# Patient Record
Sex: Female | Born: 2000 | Race: Black or African American | Hispanic: No | Marital: Single | Smoking: Never smoker
Health system: Southern US, Community
[De-identification: ages and names within clinical notes are randomized; demographics above are authoritative.]

## PROBLEM LIST (undated history)

## (undated) DIAGNOSIS — G43909 Migraine, unspecified, not intractable, without status migrainosus: Secondary | ICD-10-CM

## (undated) DIAGNOSIS — J45909 Unspecified asthma, uncomplicated: Secondary | ICD-10-CM

## (undated) DIAGNOSIS — J9801 Acute bronchospasm: Secondary | ICD-10-CM

## (undated) DIAGNOSIS — T7840XA Allergy, unspecified, initial encounter: Secondary | ICD-10-CM

## (undated) DIAGNOSIS — F419 Anxiety disorder, unspecified: Secondary | ICD-10-CM

## (undated) HISTORY — DX: Anxiety disorder, unspecified: F41.9

## (undated) HISTORY — PX: OTHER SURGICAL HISTORY: SHX169

## (undated) HISTORY — DX: Acute bronchospasm: J98.01

## (undated) HISTORY — DX: Allergy, unspecified, initial encounter: T78.40XA

## (undated) HISTORY — DX: Unspecified asthma, uncomplicated: J45.909

---
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
## 2022-01-10 ENCOUNTER — Other Ambulatory Visit: Payer: Self-pay | Admitting: Internal Medicine

## 2022-01-10 DIAGNOSIS — F411 Generalized anxiety disorder: Secondary | ICD-10-CM

## 2022-01-17 ENCOUNTER — Ambulatory Visit (INDEPENDENT_AMBULATORY_CARE_PROVIDER_SITE_OTHER): Payer: Commercial Managed Care - PPO | Admitting: Family Medicine

## 2022-01-17 ENCOUNTER — Other Ambulatory Visit: Payer: Self-pay

## 2022-01-17 ENCOUNTER — Encounter: Payer: Self-pay | Admitting: Family Medicine

## 2022-01-17 VITALS — BP 111/77 | HR 107 | Temp 99.6°F | Ht 64.0 in | Wt 209.0 lb

## 2022-01-17 DIAGNOSIS — Z3202 Encounter for pregnancy test, result negative: Secondary | ICD-10-CM | POA: Diagnosis not present

## 2022-01-17 DIAGNOSIS — Z30011 Encounter for initial prescription of contraceptive pills: Secondary | ICD-10-CM

## 2022-01-17 DIAGNOSIS — F411 Generalized anxiety disorder: Secondary | ICD-10-CM

## 2022-01-17 LAB — POCT URINE PREGNANCY: Preg Test, Ur: NEGATIVE

## 2022-01-17 MED ORDER — NORGESTIMATE-ETH ESTRADIOL 0.25-35 MG-MCG PO TABS: 1.0000 | ORAL_TABLET | Freq: Every day | ORAL | 0 refills | Status: DC

## 2022-01-17 NOTE — Progress Notes (Signed)
Discuss birth control options Medication refills  for anxiety medication- fluoxetine  LMP1/23/2023

## 2022-01-19 ENCOUNTER — Encounter: Payer: Self-pay | Admitting: Family Medicine

## 2022-01-19 NOTE — Progress Notes (Signed)
New Patient Office Visit  Subjective:  Patient ID: Brittney Green, female    DOB: [birth year]  Age: 21 y.o. MRN: [id number]  CC:  Chief Complaint  Patient presents with   Contraception    HPI Brittney Green presents for desiring birth control. Patient reports that she has been having unprotected intercourse. Denies GU sx.   Past Medical History:  Diagnosis Date   Allergic rhinitis    Allergy    Anxiety    Phreesia 03/03/2021   Asthma    reactive airway disease as infant.   Bronchospasm     Past Surgical History:  Procedure Laterality Date   tubes in ears      Family History  Problem Relation Age of Onset   Asthma Mother    COPD Maternal Grandmother    Emphysema Maternal Grandmother    Diabetes Maternal Grandfather    Depression Sister    Alcohol abuse Maternal Aunt        per patient history form drug abuse    Social History   Socioeconomic History   Marital status: Single    Spouse name: Not on file   Number of children: 0   Years of education: Not on file   Highest education level: Not on file  Occupational History   Occupation: student  Tobacco Use   Smoking status: Never   Smokeless tobacco: Never  Vaping Use   Vaping Use: Never used  Substance and Sexual Activity   Alcohol use: No   Drug use: No   Sexual activity: Never  Other Topics Concern   Not on file  Social History Narrative   Lives: Patient lives with mother, sister, grandfather; Dad in [place]; rare visits with Dad last visit in 5th grade.      Education: 8th grader; [employer]; favorite subject is math; no held back or failing; excellent student; no behavior issues.   Career goals:  Unsure yet.     Tobacco: no smokers in the home.      Pets:  none      Seatbelt: 100%      Bedtime in 2017 at 10:00pm; has cell phone.  Wakes up 7:00am.        Dental hygiene: brushes teeth once daily.  No nighttime brushing.  No flossing.      Activities: plays softball and basketball; skating; watches  YouTube.        Punishment: rarely gets punished.  Depends on severity of issue; phone taken away .      Helmets:  No helmet.    Social Determinants of Health   Financial Resource Strain: Not on file  Food Insecurity: Not on file  Transportation Needs: Not on file  Physical Activity: Not on file  Stress: Not on file  Social Connections: Not on file  Intimate Partner Violence: Not on file    ROS Review of Systems  Genitourinary:  Negative for vaginal bleeding.  All other systems reviewed and are negative.  Objective:   Today's Vitals: BP 111/77 (BP Location: Right Arm, Patient Position: Sitting, Cuff Size: Large)    Pulse (!) 107    Temp 99.6 F (37.6 C) (Oral)    Ht 5\' 4"  (1.626 m)    Wt 209 lb (94.8 kg)    LMP 01/10/2022 (Exact Date)    SpO2 98%    BMI 35.87 kg/m   Physical Exam Vitals and nursing note reviewed.  Constitutional:      General: She is not in acute distress.  Cardiovascular:     Rate and Rhythm: Normal rate and regular rhythm.  Pulmonary:     Effort: Pulmonary effort is normal.     Breath sounds: Normal breath sounds.  Abdominal:     Palpations: Abdomen is soft.     Tenderness: There is no abdominal tenderness.  Neurological:     General: No focal deficit present.     Mental Status: She is alert and oriented to person, place, and time.    Assessment & Plan:   1. Encounter for initial prescription of contraceptive pills Discussed options at length. Will prescribed ortho cyclen and monitor.  - POCT urine pregnancy  2. Generalized anxiety disorder Continue management and meds as per consultant.    Outpatient Encounter Medications as of 01/17/2022  Medication Sig   FLUoxetine (PROZAC) 10 MG capsule TAKE ONE TABLET DAILY FOR TWO WEEKS. IF TOLERATING, INCREASE TO TWO TABLETS DAILY.   norgestimate-ethinyl estradiol (ORTHO-CYCLEN, 28,) 0.25-35 MG-MCG tablet Take 1 tablet by mouth daily.   cetirizine (ZYRTEC ALLERGY) 10 MG tablet Take 1 tablet (10 mg total)  by mouth daily. (Patient not taking: Reported on 01/17/2022)   fluticasone (FLONASE) 50 MCG/ACT nasal spray Place 2 sprays into both nostrils daily. (Patient not taking: Reported on 01/17/2022)   [DISCONTINUED] cefdinir (OMNICEF) 300 MG capsule Take 1 capsule (300 mg total) by mouth 2 (two) times daily.   [DISCONTINUED] pseudoephedrine (SUDAFED) 30 MG tablet Take 1 tablet (30 mg total) by mouth every 8 (eight) hours as needed for congestion. (Patient not taking: Reported on 01/17/2022)   No facility-administered encounter medications on file as of 01/17/2022.    Follow-up: No follow-ups on file.   Tommie Raymond, MD

## 2022-02-03 ENCOUNTER — Other Ambulatory Visit: Payer: Self-pay | Admitting: Family Medicine

## 2022-02-03 ENCOUNTER — Encounter: Payer: Self-pay | Admitting: Family Medicine

## 2022-02-03 MED ORDER — FLUOXETINE HCL 20 MG PO TABS: 20.0000 mg | ORAL_TABLET | Freq: Every day | ORAL | 1 refills | Status: DC

## 2022-02-24 ENCOUNTER — Ambulatory Visit: Payer: Commercial Managed Care - PPO | Admitting: Family Medicine

## 2022-03-16 ENCOUNTER — Ambulatory Visit: Payer: Commercial Managed Care - PPO | Admitting: Family Medicine

## 2022-03-22 ENCOUNTER — Ambulatory Visit: Payer: Commercial Managed Care - PPO | Admitting: Family Medicine

## 2022-03-22 ENCOUNTER — Encounter: Payer: Self-pay | Admitting: Family Medicine

## 2022-03-22 VITALS — BP 105/73 | HR 76 | Temp 98.0°F | Resp 16 | Wt 211.6 lb

## 2022-03-22 DIAGNOSIS — F32A Depression, unspecified: Secondary | ICD-10-CM

## 2022-03-22 DIAGNOSIS — Z3041 Encounter for surveillance of contraceptive pills: Secondary | ICD-10-CM

## 2022-03-22 DIAGNOSIS — F419 Anxiety disorder, unspecified: Secondary | ICD-10-CM

## 2022-03-22 DIAGNOSIS — H663X1 Other chronic suppurative otitis media, right ear: Secondary | ICD-10-CM | POA: Diagnosis not present

## 2022-03-22 MED ORDER — NORGESTIMATE-ETH ESTRADIOL 0.25-35 MG-MCG PO TABS: 1.0000 | ORAL_TABLET | Freq: Every day | ORAL | 0 refills | Status: DC

## 2022-03-22 MED ORDER — FLUTICASONE PROPIONATE 50 MCG/ACT NA SUSP: 2.0000 | Freq: Every day | NASAL | 0 refills | Status: DC

## 2022-03-22 MED ORDER — CEFDINIR 300 MG PO CAPS: 300.0000 mg | ORAL_CAPSULE | Freq: Two times a day (BID) | ORAL | 0 refills | Status: DC

## 2022-03-22 MED ORDER — FLUOXETINE HCL 20 MG PO TABS: 20.0000 mg | ORAL_TABLET | Freq: Every day | ORAL | 1 refills | Status: DC

## 2022-03-22 NOTE — Progress Notes (Signed)
Patient has possible ear infection.  ?

## 2022-03-22 NOTE — Progress Notes (Signed)
? ?Established Patient Office Visit ? ?Subjective:  ?Patient ID: Brittney Green, female    DOB: 2001-10-12  Age: 21 y.o. MRN: [id number] ? ?CC: No chief complaint on file. ? ? ?HPI ?Kelsye Loomer presents for complaint of right ear pain. Patient reports that she has had recurrent issues with the ear. She denies fever/chills or viral sx. She also is here for follow up of contraception and anxiety/depression and she reports much improvement with present management.  ? ?Past Medical History:  ?Diagnosis Date  ? Allergic rhinitis   ? Allergy   ? Anxiety   ? Phreesia 03/03/2021  ? Asthma   ? reactive airway disease as infant.  ? Bronchospasm   ? ? ?Past Surgical History:  ?Procedure Laterality Date  ? tubes in ears    ? ? ?Family History  ?Problem Relation Age of Onset  ? Asthma Mother   ? COPD Maternal Grandmother   ? Emphysema Maternal Grandmother   ? Diabetes Maternal Grandfather   ? Depression Sister   ? Alcohol abuse Maternal Aunt   ?     per patient history form drug abuse  ? ? ?Social History  ? ?Socioeconomic History  ? Marital status: Single  ?  Spouse name: Not on file  ? Number of children: 0  ? Years of education: Not on file  ? Highest education level: Not on file  ?Occupational History  ? Occupation: student  ?Tobacco Use  ? Smoking status: Never  ? Smokeless tobacco: Never  ?Vaping Use  ? Vaping Use: Never used  ?Substance and Sexual Activity  ? Alcohol use: No  ? Drug use: No  ? Sexual activity: Never  ?Other Topics Concern  ? Not on file  ?Social History Narrative  ? Lives: Patient lives with mother, sister, grandfather; Dad in [place]; rare visits with Dad last visit in 5th grade.  ?    Education: 8th grader; ABs; favorite subject is math; no held back or failing; excellent student; no behavior issues.   Career goals:  Unsure yet.  ?   Tobacco: no smokers in the home.  ?    Pets:  none  ?    Seatbelt: 100%  ?    Bedtime in 2017 at 10:00pm; has cell phone.  Wakes up 7:00am.    ?    Dental hygiene:  brushes teeth once daily.  No nighttime brushing.  No flossing.  ?    Activities: plays softball and basketball; skating; watches YouTube.    ?    Punishment: rarely gets punished.  Depends on severity of issue; phone taken away .  ?    Helmets:  No helmet.   ? ?Social Determinants of Health  ? ?Financial Resource Strain: Not on file  ?Food Insecurity: Not on file  ?Transportation Needs: Not on file  ?Physical Activity: Not on file  ?Stress: Not on file  ?Social Connections: Not on file  ?Intimate Partner Violence: Not on file  ? ? ?ROS ?Review of Systems  ?Constitutional:  Negative for chills and fever.  ?HENT:  Positive for ear pain. Negative for congestion, ear discharge and hearing loss.   ?Genitourinary: Negative.   ?Psychiatric/Behavioral:  Negative for self-injury, sleep disturbance and suicidal ideas. The patient is not nervous/anxious.   ?All other systems reviewed and are negative. ? ?Objective:  ? ?Today's Vitals: BP 105/73   Pulse 76   Temp 98 ?F (36.7 ?C) (Oral)   Resp 16   Wt 211 lb 9.6 oz (96  kg)   SpO2 97%   BMI 36.32 kg/m?  ? ?Physical Exam ?Vitals and nursing note reviewed.  ?Constitutional:   ?   General: She is not in acute distress. ?   Appearance: She is obese.  ?HENT:  ?   Head: Normocephalic and atraumatic.  ?   Right Ear: Ear canal and external ear normal. Tenderness present. No drainage. A middle ear effusion is present. Tympanic membrane is bulging.  ?   Left Ear: Tympanic membrane, ear canal and external ear normal.  ?Cardiovascular:  ?   Rate and Rhythm: Normal rate and regular rhythm.  ?Pulmonary:  ?   Effort: Pulmonary effort is normal.  ?   Breath sounds: Normal breath sounds.  ?Abdominal:  ?   Palpations: Abdomen is soft.  ?   Tenderness: There is no abdominal tenderness.  ?Neurological:  ?   General: No focal deficit present.  ?   Mental Status: She is alert and oriented to person, place, and time.  ?Psychiatric:     ?   Mood and Affect: Mood normal.     ?   Behavior: Behavior  normal.  ? ? ?Assessment & Plan:  ? ? ?1. Chronic suppurative otitis media of right ear, unspecified otitis media location ?Omnicef prescribed. Tylenol/nsaids prn. Referral to ENT for further eval/mgt of recurrent symptoms.  ?- Ambulatory referral to ENT ? ?2. Encounter for surveillance of contraceptive pills ?Doing well with present management. continue ? ?3. Anxiety and depression ?Reports improvement. Continue present management and monitor ? ? ?Outpatient Encounter Medications as of 03/22/2022  ?Medication Sig  ? cefdinir (OMNICEF) 300 MG capsule Take 1 capsule (300 mg total) by mouth 2 (two) times daily.  ? cetirizine (ZYRTEC ALLERGY) 10 MG tablet Take 1 tablet (10 mg total) by mouth daily. (Patient not taking: Reported on 01/17/2022)  ? FLUoxetine (PROZAC) 20 MG tablet Take 1 tablet (20 mg total) by mouth daily.  ? fluticasone (FLONASE) 50 MCG/ACT nasal spray Place 2 sprays into both nostrils daily. (Patient not taking: Reported on 01/17/2022)  ? norgestimate-ethinyl estradiol (ORTHO-CYCLEN, 28,) 0.25-35 MG-MCG tablet Take 1 tablet by mouth daily.  ? [DISCONTINUED] FLUoxetine (PROZAC) 20 MG tablet Take 1 tablet (20 mg total) by mouth daily.  ? [DISCONTINUED] norgestimate-ethinyl estradiol (ORTHO-CYCLEN, 28,) 0.25-35 MG-MCG tablet Take 1 tablet by mouth daily.  ? ?No facility-administered encounter medications on file as of 03/22/2022.  ? ? ?Follow-up: Return in about 6 months (around 09/21/2022) for follow up.  ? ?Tommie Raymond, MD ? ?

## 2022-05-12 DIAGNOSIS — H66006 Acute suppurative otitis media without spontaneous rupture of ear drum, recurrent, bilateral: Secondary | ICD-10-CM | POA: Insufficient documentation

## 2022-05-21 ENCOUNTER — Other Ambulatory Visit: Payer: Self-pay | Admitting: Internal Medicine

## 2022-05-21 DIAGNOSIS — F411 Generalized anxiety disorder: Secondary | ICD-10-CM

## 2022-05-21 NOTE — Telephone Encounter (Signed)
Fluoxetine refilled 03/22/2022 by Georganna Skeans, MD for 6 month supply. Call patient to confirm necessity of early refill request.

## 2022-05-23 NOTE — Telephone Encounter (Signed)
I have attempted to contact this patient by phone, but line is busy. I will continue to try later.

## 2022-06-30 ENCOUNTER — Encounter: Payer: Self-pay | Admitting: Family Medicine

## 2022-06-30 ENCOUNTER — Ambulatory Visit (INDEPENDENT_AMBULATORY_CARE_PROVIDER_SITE_OTHER): Payer: Commercial Managed Care - PPO | Admitting: Family Medicine

## 2022-06-30 VITALS — BP 118/74 | HR 90 | Temp 98.0°F | Resp 16 | Wt 212.8 lb

## 2022-06-30 DIAGNOSIS — F419 Anxiety disorder, unspecified: Secondary | ICD-10-CM | POA: Diagnosis not present

## 2022-06-30 DIAGNOSIS — H663X1 Other chronic suppurative otitis media, right ear: Secondary | ICD-10-CM | POA: Diagnosis not present

## 2022-06-30 DIAGNOSIS — F32A Depression, unspecified: Secondary | ICD-10-CM | POA: Diagnosis not present

## 2022-06-30 MED ORDER — FLUOXETINE HCL 20 MG PO CAPS: 20.0000 mg | ORAL_CAPSULE | Freq: Every day | ORAL | 1 refills | Status: DC

## 2022-06-30 MED ORDER — FLUTICASONE PROPIONATE 50 MCG/ACT NA SUSP: 2.0000 | Freq: Every day | NASAL | 0 refills | Status: DC

## 2022-06-30 MED ORDER — CETIRIZINE HCL 10 MG PO TABS: 10.0000 mg | ORAL_TABLET | Freq: Every day | ORAL | 5 refills | Status: DC

## 2022-06-30 NOTE — Progress Notes (Signed)
Patient is having a recurrent ear infection  that she gets very often, per patient. Patient said this stared 2 days ago

## 2022-07-02 ENCOUNTER — Encounter: Payer: Self-pay | Admitting: Family Medicine

## 2022-07-02 NOTE — Progress Notes (Signed)
EStablished Patient Office Visit  Subjective    Patient ID: Brittney Green, female    DOB: 09-14-2001  Age: 21 y.o. MRN: [id number]  CC:  Chief Complaint  Patient presents with   Ear Pain    HPI Brittney Green presents for routine follow up of chronic med issues.    Outpatient Encounter Medications as of 06/30/2022  Medication Sig   cefdinir (OMNICEF) 300 MG capsule Take 1 capsule (300 mg total) by mouth 2 (two) times daily.   cetirizine (ZYRTEC) 10 MG tablet Take 1 tablet (10 mg total) by mouth daily.   fluticasone (FLONASE) 50 MCG/ACT nasal spray Place 2 sprays into both nostrils daily.   norgestimate-ethinyl estradiol (ORTHO-CYCLEN, 28,) 0.25-35 MG-MCG tablet Take 1 tablet by mouth daily.   [DISCONTINUED] FLUoxetine (PROZAC) 20 MG tablet Take 1 tablet (20 mg total) by mouth daily.   [DISCONTINUED] fluticasone (FLONASE) 50 MCG/ACT nasal spray Place 2 sprays into both nostrils daily.   FLUoxetine (PROZAC) 20 MG capsule Take 1 capsule (20 mg total) by mouth daily.   [DISCONTINUED] cetirizine (ZYRTEC ALLERGY) 10 MG tablet Take 1 tablet (10 mg total) by mouth daily. (Patient not taking: Reported on 01/17/2022)   [DISCONTINUED] FLUoxetine (PROZAC) 20 MG capsule Take 20 mg by mouth daily.   No facility-administered encounter medications on file as of 06/30/2022.    Past Medical History:  Diagnosis Date   Allergic rhinitis    Allergy    Anxiety    Phreesia 03/03/2021   Asthma    reactive airway disease as infant.   Bronchospasm     Past Surgical History:  Procedure Laterality Date   tubes in ears      Family History  Problem Relation Age of Onset   Asthma Mother    COPD Maternal Grandmother    Emphysema Maternal Grandmother    Diabetes Maternal Grandfather    Depression Sister    Alcohol abuse Maternal Aunt        per patient history form drug abuse    Social History   Socioeconomic History   Marital status: Single    Spouse name: Not on file   Number of  children: 0   Years of education: Not on file   Highest education level: Not on file  Occupational History   Occupation: student  Tobacco Use   Smoking status: Never   Smokeless tobacco: Never  Vaping Use   Vaping Use: Never used  Substance and Sexual Activity   Alcohol use: No   Drug use: No   Sexual activity: Never  Other Topics Concern   Not on file  Social History Narrative   Lives: Patient lives with mother, sister, grandfather; Dad in [place]; rare visits with Dad last visit in 5th grade.      Education: 8th grader; ABs; favorite subject is math; no held back or failing; excellent student; no behavior issues.   Career goals:  Unsure yet.     Tobacco: no smokers in the home.      Pets:  none      Seatbelt: 100%      Bedtime in 2017 at 10:00pm; has cell phone.  Wakes up 7:00am.        Dental hygiene: brushes teeth once daily.  No nighttime brushing.  No flossing.      Activities: plays softball and basketball; skating; watches YouTube.        Punishment: rarely gets punished.  Depends on severity of issue; phone taken away .  Helmets:  No helmet.    Social Determinants of Health   Financial Resource Strain: Not on file  Food Insecurity: Not on file  Transportation Needs: Not on file  Physical Activity: Not on file  Stress: Not on file  Social Connections: Not on file  Intimate Partner Violence: Not on file    Review of Systems  HENT:  Positive for ear pain. Negative for ear discharge and hearing loss.   All other systems reviewed and are negative.       Objective    BP 118/74   Pulse 90   Temp 98 F (36.7 C) (Oral)   Resp 16   Wt 212 lb 12.8 oz (96.5 kg)   SpO2 97%   BMI 36.53 kg/m   Physical Exam Vitals and nursing note reviewed.  Constitutional:      General: She is not in acute distress.    Appearance: She is obese.  HENT:     Head: Normocephalic and atraumatic.     Right Ear: Ear canal and external ear normal. Tenderness present. No  drainage. A middle ear effusion is present.     Left Ear: Tympanic membrane, ear canal and external ear normal.  Cardiovascular:     Rate and Rhythm: Normal rate and regular rhythm.  Pulmonary:     Effort: Pulmonary effort is normal.     Breath sounds: Normal breath sounds.  Abdominal:     Palpations: Abdomen is soft.     Tenderness: There is no abdominal tenderness.  Neurological:     General: No focal deficit present.     Mental Status: She is alert and oriented to person, place, and time.  Psychiatric:        Mood and Affect: Mood normal.        Behavior: Behavior normal.         Assessment & Plan:   1. Anxiety and depression Improved with present management. Continue and moniotor. Meds refilled.   2. Chronic suppurative otitis media of right ear, unspecified otitis media location Flonase and zyrtec prescribed and keep scheduled appt with ENT for follow up. Tylenol/nsaids prn.    Return in about 3 months (around 09/30/2022) for follow up.   Brittney Raymond, MD

## 2022-07-04 ENCOUNTER — Other Ambulatory Visit: Payer: Self-pay | Admitting: Internal Medicine

## 2022-07-04 ENCOUNTER — Other Ambulatory Visit: Payer: Self-pay | Admitting: Family Medicine

## 2022-07-04 DIAGNOSIS — F411 Generalized anxiety disorder: Secondary | ICD-10-CM

## 2022-07-04 NOTE — Telephone Encounter (Signed)
Please refill medication for patient that was here on 074/13/2023

## 2022-07-05 NOTE — Telephone Encounter (Signed)
Requested Prescriptions  Pending Prescriptions Disp Refills  . norgestimate-ethinyl estradiol (ORTHO-CYCLEN) 0.25-35 MG-MCG tablet [Pharmacy Med Name: NORG-ETHIN ESTRA 0.25-0.035 MG] 84 tablet 3    Sig: TAKE 1 TABLET BY MOUTH EVERY DAY     OB/GYN:  Contraceptives Passed - 07/04/2022  6:06 PM      Passed - Last BP in normal range    BP Readings from Last 1 Encounters:  06/30/22 118/74         Passed - Valid encounter within last 12 months    Recent Outpatient Visits          5 days ago Anxiety and depression   [facility] at [facility], MD   3 months ago Chronic suppurative otitis media of right ear, unspecified otitis media location   [facility] at [facility], MD   5 months ago Encounter for initial prescription of contraceptive pills   Primary Care at [facility], MD   9 months ago COVID-19   Primary Care at [facility], Gildardo Pounds, NP   1 year ago Generalized anxiety disorder   Primary Care at [facility], Kandee Keen, MD             Passed - Patient is not a smoker

## 2022-07-06 ENCOUNTER — Other Ambulatory Visit: Payer: Self-pay | Admitting: Family Medicine

## 2022-07-06 MED ORDER — FLUOXETINE HCL 20 MG PO CAPS: 20.0000 mg | ORAL_CAPSULE | Freq: Every day | ORAL | 1 refills | Status: DC

## 2022-07-06 MED ORDER — NORGESTIMATE-ETH ESTRADIOL 0.25-35 MG-MCG PO TABS: 1.0000 | ORAL_TABLET | Freq: Every day | ORAL | 1 refills | Status: DC

## 2022-08-09 ENCOUNTER — Encounter: Payer: Self-pay | Admitting: Family Medicine

## 2022-08-30 ENCOUNTER — Ambulatory Visit: Payer: Commercial Managed Care - PPO | Admitting: Physician Assistant

## 2022-08-30 ENCOUNTER — Encounter: Payer: Self-pay | Admitting: Physician Assistant

## 2022-08-30 DIAGNOSIS — J028 Acute pharyngitis due to other specified organisms: Secondary | ICD-10-CM | POA: Diagnosis not present

## 2022-08-30 DIAGNOSIS — B9689 Other specified bacterial agents as the cause of diseases classified elsewhere: Secondary | ICD-10-CM

## 2022-08-30 MED ORDER — CEFDINIR 300 MG PO CAPS: 300.0000 mg | ORAL_CAPSULE | Freq: Two times a day (BID) | ORAL | 0 refills | Status: DC

## 2022-08-30 NOTE — Patient Instructions (Signed)
You are going to take cefdinir twice a day for 7 days.  I encourage you to continue using your Zyrtec and resume using your Flonase.  You can also use Tylenol or ibuprofen to help you with pain control.  I encourage you to get plenty of rest and stay very well-hydrated.  I hope that you feel better soon, please let us know if there is anything else we can do for you  Roney Jaffe, PA-C Physician Assistant [facility] [email or web address]   Pharyngitis  Pharyngitis is inflammation of the throat (pharynx). It is a very common cause of sore throat. Pharyngitis can be caused by a bacteria, but it is usually caused by a virus. Most cases of pharyngitis get better on their own without treatment. What are the causes? This condition may be caused by: Infection by viruses (viral). Viral pharyngitis spreads easily from person to person (is contagious) through coughing, sneezing, and sharing of personal items or utensils such as cups, forks, spoons, and toothbrushes. Infection by bacteria (bacterial). Bacterial pharyngitis may be spread by touching the nose or face after coming in contact with the bacteria, or through close contact, such as kissing. Allergies. Allergies can cause buildup of mucus in the throat (post-nasal drip), leading to inflammation and irritation. Allergies can also cause blocked nasal passages, forcing breathing through the mouth, which dries and irritates the throat. What increases the risk? You are more likely to develop this condition if: You are 19-72 years old. You are exposed to crowded environments such as daycare, school, or dormitory living. You live in a cold climate. You have a weakened disease-fighting (immune) system. What are the signs or symptoms? Symptoms of this condition vary by the cause. Common symptoms of this condition include: Sore throat. Fatigue. Low-grade fever. Stuffy nose (nasal congestion) and  cough. Headache. Other symptoms may include: Glands in the neck (lymph nodes) that are swollen. Skin rashes. Plaque-like film on the throat or tonsils. This is often a symptom of bacterial pharyngitis. Vomiting. Red, itchy eyes (conjunctivitis). Loss of appetite. Joint pain and muscle aches. Enlarged tonsils. How is this diagnosed? This condition may be diagnosed based on your medical history and a physical exam. Your health care provider will ask you questions about your illness and your symptoms. A swab of your throat may be done to check for bacteria (rapid strep test). Other lab tests may also be done, depending on the suspected cause, but these are rare. How is this treated? Many times, treatment is not needed for this condition. Pharyngitis usually gets better in 3-4 days without treatment. Bacterial pharyngitis may be treated with antibiotic medicines. Follow these instructions at home: Medicines Take over-the-counter and prescription medicines only as told by your health care provider. If you were prescribed an antibiotic medicine, take it as told by your health care provider. Do not stop taking the antibiotic even if you start to feel better. Use throat sprays to soothe your throat as told by your health care provider. Children can get pharyngitis. Do not give your child aspirin because of the association with Reye's syndrome. Managing pain To help with pain, try: Sipping warm liquids, such as broth, herbal tea, or warm water. Eating or drinking cold or frozen liquids, such as frozen ice pops. Gargling with a mixture of salt and water 3-4 times a day or as needed. To make salt water, completely dissolve -1 tsp (3-6 g) of salt in 1 cup (237 mL) of warm water. Sucking on hard  candy or throat lozenges. Putting a cool-mist humidifier in your bedroom at night to moisten the air. Sitting in the bathroom with the door closed for 5-10 minutes while you run hot water in the  shower.  General instructions  Do not use any products that contain nicotine or tobacco. These products include cigarettes, chewing tobacco, and vaping devices, such as e-cigarettes. If you need help quitting, ask your health care provider. Rest as told by your health care provider. Drink enough fluid to keep your urine pale yellow. How is this prevented? To help prevent becoming infected or spreading infection: Wash your hands often with soap and water for at least 20 seconds. If soap and water are not available, use hand sanitizer. Do not touch your eyes, nose, or mouth with unwashed hands, and wash hands after touching these areas. Do not share cups or eating utensils. Avoid close contact with people who are sick. Contact a health care provider if: You have large, tender lumps in your neck. You have a rash. You cough up green, yellow-brown, or bloody mucus. Get help right away if: Your neck becomes stiff. You drool or are unable to swallow liquids. You cannot drink or take medicines without vomiting. You have severe pain that does not go away, even after you take medicine. You have trouble breathing, and it is not caused by a stuffy nose. You have new pain and swelling in your joints such as the knees, ankles, wrists, or elbows. These symptoms may represent a serious problem that is an emergency. Do not wait to see if the symptoms will go away. Get medical help right away. Call your local emergency services (911 in the [place] Do not drive yourself to the hospital. Summary Pharyngitis is redness, pain, and swelling (inflammation) of the throat (pharynx). While pharyngitis can be caused by a bacteria, the most common causes are viral. Most cases of pharyngitis get better on their own without treatment. Bacterial pharyngitis is treated with antibiotic medicines. This information is not intended to replace advice given to you by your health care provider. Make sure you discuss any  questions you have with your health care provider. Document Revised: 03/03/2021 Document Reviewed: 03/03/2021 Elsevier Patient Education  2023 [employer].

## 2022-08-30 NOTE — Progress Notes (Signed)
Virtual Visit Consent   Brittney Green, you are scheduled for a virtual visit with a [facility] provider today. Just as with appointments in the office, your consent must be obtained to participate. Your consent will be active for this visit and any virtual visit you may have with one of our providers in the next 365 days. If you have a MyChart account, a copy of this consent can be sent to you electronically.  As this is a virtual visit,  technology does not allow for your provider to perform a traditional examination. This may limit your provider's ability to fully assess your condition. If your provider identifies any concerns that need to be evaluated in person or the need to arrange testing (such as labs, EKG, etc.), we will make arrangements to do so.   By engaging in this virtual visit, you consent to the provision of healthcare and authorize for your insurance to be billed (if applicable) for the services provided during this visit. Depending on your insurance coverage, you may receive a charge related to this service.  I need to obtain your verbal consent now. Are you willing to proceed with your visit today? Brittney Green has provided verbal consent on 08/30/2022 for a virtual visit ( telephone). Roney Jaffe, PA-C  Date: 08/30/2022 9:54 AM  Virtual Visit via Telephone Note   I, Brittney Green, connected with  Brittney Green  ([id number], 2016-06-10) on 08/30/22 at 10:10 AM EDT by a  telemedicine application and verified that I am speaking with the correct person using two identifiers.  Location: Patient: Virtual Visit Location Patient: Mobile Provider: Virtual Visit Location Provider: Office/Clinic   I discussed the limitations of evaluation and management by telemedicine and the availability of in person appointments. The patient expressed understanding and agreed to proceed.    History of Present Illness: Brittney Green is a 21 y.o. states that she has been experiencing a sore  throat since Sunday, states that this morning she started having a headache and both the ears hurt.  Also endorses productive cough with yellow sputum.  Denies sick contacts, negative home COVID test.  States that she has been using Zyrtec and Tylenol without much relief.  States that she does have difficulty eating and drinking currently due to throat pain.  Review of Systems  Constitutional:  Negative for chills and fever.  HENT:  Positive for congestion, ear pain and sore throat. Negative for sinus pain.   Eyes: Negative.   Respiratory:  Positive for cough and sputum production. Negative for shortness of breath and wheezing.   Cardiovascular:  Negative for chest pain.  Gastrointestinal:  Negative for abdominal pain, nausea and vomiting.  Genitourinary: Negative.   Musculoskeletal:  Negative for myalgias.  Skin: Negative.   Neurological:  Positive for headaches.  Endo/Heme/Allergies: Negative.   Psychiatric/Behavioral: Negative.        Problems:  Patient Active Problem List   Diagnosis Date Noted   Recurrent acute suppurative otitis media without spontaneous rupture of tympanic membrane of both sides 05/12/2022   Generalized anxiety disorder 02/25/2020   Overweight child 11/17/2013   Allergic rhinitis     Allergies:  Allergies  Allergen Reactions   Amoxicillin Rash and Hives   Medications:  Current Outpatient Medications:    cefdinir (OMNICEF) 300 MG capsule, Take 1 capsule (300 mg total) by mouth 2 (two) times daily., Disp: 14 capsule, Rfl: 0   cetirizine (ZYRTEC) 10 MG tablet, Take 1 tablet (10 mg total) by mouth daily.,  Disp: 30 tablet, Rfl: 5   FLUoxetine (PROZAC) 20 MG capsule, Take 1 capsule (20 mg total) by mouth daily., Disp: 90 capsule, Rfl: 1   fluticasone (FLONASE) 50 MCG/ACT nasal spray, Place 2 sprays into both nostrils daily., Disp: 16 g, Rfl: 0   norgestimate-ethinyl estradiol (ORTHO-CYCLEN) 0.25-35 MG-MCG tablet, Take 1 tablet by mouth daily., Disp: 84  tablet, Rfl: 1  Observations/Objective: Medical history and current medications reviewed, no physical exam completed   Assessment and Plan: 1. Acute bacterial pharyngitis - cefdinir (OMNICEF) 300 MG capsule; Take 1 capsule (300 mg total) by mouth 2 (two) times daily.  Dispense: 14 capsule; Refill: 0  Patient education given on supportive care, continue Zyrtec, Tylenol, restart Flonase.  Red flags given for prompt reevaluation.  Follow Up Instructions: I discussed the assessment and treatment plan with the patient. The patient was provided an opportunity to ask questions and all were answered. The patient agreed with the plan and demonstrated an understanding of the instructions.  A copy of instructions were sent to the patient via MyChart unless otherwise noted below.     The patient was advised to call back or seek an in-person evaluation if the symptoms worsen or if the condition fails to improve as anticipated.  Time:  I spent 12 minutes with the patient via telehealth technology discussing the above problems/concerns.    Brittney Knudsen Mayers, PA-C

## 2022-09-05 ENCOUNTER — Encounter: Payer: Self-pay | Admitting: Physician Assistant

## 2022-09-26 ENCOUNTER — Telehealth: Payer: Self-pay | Admitting: Family Medicine

## 2022-09-26 NOTE — Telephone Encounter (Signed)
Called pt at primary #=no answer, Voicemail said  "You've reached [facility]" so I didn't LVM.   Called secondary # = answered and call dropped  Reason for calls: 2  1) Pt had requested in August immunization record for school. Nurse had this ready for pick up since. Following up to check when pt would be able to pick this up if still needed.  2) follow up appt for October w/ PCP was not scheduled at last appt (pt walked out - didn't check out).

## 2022-10-03 ENCOUNTER — Other Ambulatory Visit: Payer: Self-pay | Admitting: Internal Medicine

## 2022-10-03 DIAGNOSIS — F411 Generalized anxiety disorder: Secondary | ICD-10-CM

## 2022-10-17 ENCOUNTER — Encounter: Payer: Self-pay | Admitting: Family Medicine

## 2022-10-17 ENCOUNTER — Telehealth: Payer: Commercial Managed Care - PPO | Admitting: Physician Assistant

## 2022-10-17 DIAGNOSIS — L301 Dyshidrosis [pompholyx]: Secondary | ICD-10-CM

## 2022-10-17 MED ORDER — TRIAMCINOLONE ACETONIDE 0.1 % EX CREA: 1.0000 | TOPICAL_CREAM | Freq: Four times a day (QID) | CUTANEOUS | 0 refills | Status: DC

## 2022-10-17 NOTE — Patient Instructions (Signed)
Darrol Jump, thank you for joining Margaretann Loveless, PA-C for today's virtual visit.  While this provider is not your primary care provider (PCP), if your PCP is located in our provider database this encounter information will be shared with them immediately following your visit.   A [facility] MyChart account gives you access to today's visit and all your visits, tests, and labs performed at [facility] " click here if you don't have a [facility] MyChart account or go to [email or web address]  Consent: (Patient) Brittney Green provided verbal consent for this virtual visit at the beginning of the encounter.  Current Medications:  Current Outpatient Medications:    triamcinolone cream (KENALOG) 0.1 %, Apply 1 Application topically 4 (four) times daily., Disp: 80 g, Rfl: 0   cefdinir (OMNICEF) 300 MG capsule, Take 1 capsule (300 mg total) by mouth 2 (two) times daily., Disp: 14 capsule, Rfl: 0   cetirizine (ZYRTEC) 10 MG tablet, Take 1 tablet (10 mg total) by mouth daily., Disp: 30 tablet, Rfl: 5   FLUoxetine (PROZAC) 20 MG capsule, Take 1 capsule (20 mg total) by mouth daily., Disp: 90 capsule, Rfl: 1   fluticasone (FLONASE) 50 MCG/ACT nasal spray, Place 2 sprays into both nostrils daily., Disp: 16 g, Rfl: 0   norgestimate-ethinyl estradiol (ORTHO-CYCLEN) 0.25-35 MG-MCG tablet, Take 1 tablet by mouth daily., Disp: 84 tablet, Rfl: 1   Medications ordered in this encounter:  Meds ordered this encounter  Medications   triamcinolone cream (KENALOG) 0.1 %    Sig: Apply 1 Application topically 4 (four) times daily.    Dispense:  80 g    Refill:  0    Order Specific Question:   Supervising Provider    Answer:   Merrilee Jansky [[id number]]     *If you need refills on other medications prior to your next appointment, please contact your pharmacy*  Follow-Up: Call back or seek an in-person evaluation if the symptoms worsen or if the condition fails to improve as  anticipated.  [facility] [phone]  Other Instructions  Dyshidrotic Eczema Dyshidrotic eczema, also known as pompholyx, is a type of eczema that causes very itchy, fluid-filled blisters (vesicles) to form on the hands and feet. It is more common before age 58, though it can affect people of any age. There is no cure, but treatment and certain lifestyle changes can help relieve symptoms. What are the causes? The cause of this condition is not known. What increases the risk? You are more likely to develop this condition if: You wash your hands frequently. You have a personal or family history of eczema, allergies, asthma, or hay fever. You are allergic to metals, such as nickel or cobalt. You work with cement. You smoke. What are the signs or symptoms? Symptoms of this condition may affect the hands, the feet, or both. Symptoms may come and go (recur), and may include: Severe itching. This may happen before blisters appear. Blisters. These may form suddenly. In the early stages, blisters may form near the fingertips. In severe cases, blisters may grow to large blister masses (bullae). Blisters resolve in 2-3 weeks without bursting. This is followed by a dry phase in which itching eases. Pain and swelling. Cracks or long, narrow openings (fissures) in the skin. Severe dryness. Ridges on the nails. How is this diagnosed? This condition may be diagnosed based on: Your symptoms and a physical exam. Your medical history. Skin scrapings to rule out a fungal infection. Testing a  swab of fluid for bacteria (culture). Removing a small piece of skin (biopsy) to test for infection or to rule out other conditions. Skin patch tests. These tests involve using patches that contain possible allergens and placing them on your back. Your health care provider will wait a few days and then check to see if an allergic reaction occurred. These tests may be done if your health care  provider suspects allergic reactions, or to rule out other types of eczema. You may be referred to a health care provider who specializes in skin conditions (dermatologist) to help diagnose and treat this condition. How is this treated? There is no cure for this condition, but treatment can help relieve symptoms. Depending on the amount and severity of the blisters, your health care provider may suggest: Avoiding allergens, irritants, or triggers that worsen symptoms. This may involve lifestyle changes, such as: Using different lotions or soaps. Avoiding hot weather or places that will cause you to sweat a lot. Managing stress with coping techniques, such as relaxation and exercise, and asking for help when you need it. Diet changes as recommended by your health care provider. Using a clean, damp towel (cool compress) to relieve symptoms. Soaking in a bath that contains a type of salt that relieves irritation (aluminum acetate soaks). Medicines, such as: Medicine taken by mouth to reduce itching (oral antihistamines). Medicine applied to the skin to reduce swelling and irritation (topical corticosteroids). Medicine that reduces the activity of the body's disease-fighting system (immunosuppressants) to treat inflammation. This may be given in severe cases. Antibiotic medicines to treat bacterial infection. Light therapy (phototherapy). This involves shining ultraviolet (UV) light on the affected skin in order to reduce itchiness and inflammation. Follow these instructions at home: Bathing and skin care  Wash skin gently. After bathing or washing your hands, pat your skin dry. Avoid rubbing your skin. Remove all jewelry before bathing. If the skin under the jewelry stays wet, blisters may form or get worse. Apply cool compresses as told by your health care provider. To do this: Soak a clean towel in cool water. Wring out excess water until towel is damp. Place the towel over the affected skin.  Leave the towel on for 20 minutes at a time, 2-3 times a day. Use mild soaps, cleansers, and lotions that do not contain dyes, perfumes, or other irritants. Keep your skin hydrated. To do this: Avoid very hot water. Take lukewarm baths or showers. Apply moisturizer within 3 minutes of bathing. This locks in moisture. Medicines Take and apply over-the-counter and prescription medicines only as told by your health care provider. If you were prescribed an antibiotic medicine, take or apply it as told by your health care provider. Do not stop using the antibiotic even if you start to feel better. General instructions Do not use any products that contain nicotine or tobacco. These include cigarettes, chewing tobacco, and vaping devices, such as e-cigarettes. If you need help quitting, ask your health care provider. Identify and avoid triggers and allergens. Keep fingernails short to avoid breaking the skin while scratching. Use waterproof gloves to protect your hands when doing work that keeps your hands wet for a long time. Wear socks to keep your feet dry. Keep all follow-up visits. This is important. Contact a health care provider if: You have symptoms that do not go away. You have signs of infection, such as: Crusting, pus, or a bad smell. More redness, swelling, or pain. Increased warmth in the affected area. Get help  right away if: Your skin gets streaking redness with associated pain. Summary Dyshidrotic eczema, also known as pompholyx, is a type of eczema that causes very itchy, fluid-filled blisters (vesicles) to form on the hands and feet. The cause of this condition is not known. There is no cure for this condition, but treatment can help relieve symptoms. Treatment depends on the amount and severity of the blisters. Use mild soaps, cleansers, and lotions that do not contain dyes, perfumes, or other irritants. Keep your skin hydrated. This information is not intended to replace  advice given to you by your health care provider. Make sure you discuss any questions you have with your health care provider. Document Revised: 09/14/2020 Document Reviewed: 09/14/2020 Elsevier Patient Education  2023 Elsevier Inc.    If you have been instructed to have an in-person evaluation today at a local Urgent Care facility, please use the link below. It will take you to a list of all of our available [facility], including address, phone number and hours of operation. Please do not delay care.  [facility]  If you or a family member do not have a primary care provider, use the link below to schedule a visit and establish care. When you choose a [facility] primary care physician or advanced practice provider, you gain a long-term partner in health. Find a Primary Care Provider  Learn more about [facility]'s in-office and virtual care options: [place] - Get Care Now

## 2022-10-17 NOTE — Progress Notes (Signed)
Virtual Visit Consent   Brittney Green, you are scheduled for a virtual visit with a [facility] provider today. Just as with appointments in the office, your consent must be obtained to participate. Your consent will be active for this visit and any virtual visit you may have with one of our providers in the next 365 days. If you have a MyChart account, a copy of this consent can be sent to you electronically.  As this is a virtual visit, video technology does not allow for your provider to perform a traditional examination. This may limit your provider's ability to fully assess your condition. If your provider identifies any concerns that need to be evaluated in person or the need to arrange testing (such as labs, EKG, etc.), we will make arrangements to do so. Although advances in technology are sophisticated, we cannot ensure that it will always work on either your end or our end. If the connection with a video visit is poor, the visit may have to be switched to a telephone visit. With either a video or telephone visit, we are not always able to ensure that we have a secure connection.  By engaging in this virtual visit, you consent to the provision of healthcare and authorize for your insurance to be billed (if applicable) for the services provided during this visit. Depending on your insurance coverage, you may receive a charge related to this service.  I need to obtain your verbal consent now. Are you willing to proceed with your visit today? Ira Busbin has provided verbal consent on 10/17/2022 for a virtual visit (video or telephone). Margaretann Loveless, PA-C  Date: 10/17/2022 11:05 AM  Virtual Visit via Video Note   I, Margaretann Loveless, connected with  Brittney Green  ([id number], May 04, 2001) on 10/17/22 at 11:00 AM EDT by a video-enabled telemedicine application and verified that I am speaking with the correct person using two identifiers.  Location: Patient: Virtual Visit  Location Patient: Home Provider: Virtual Visit Location Provider: Home Office   I discussed the limitations of evaluation and management by telemedicine and the availability of in person appointments. The patient expressed understanding and agreed to proceed.    History of Present Illness: Brittney Green is a 21 y.o. who identifies as a female who was assigned female at birth, and is being seen today for rash.  HPI: Rash This is a new problem. The current episode started 1 to 4 weeks ago. The problem has been gradually worsening since onset. The affected locations include the right hand and right fingers. The rash is characterized by itchiness, redness, blistering, scaling, burning, pain and swelling. Associated with: latex gloves. Pertinent negatives include no congestion, cough, fatigue, fever, joint pain or rhinorrhea. Past treatments include anti-itch cream and moisturizer. The treatment provided no relief. Her past medical history is significant for eczema.     Problems:  Patient Active Problem List   Diagnosis Date Noted   Recurrent acute suppurative otitis media without spontaneous rupture of tympanic membrane of both sides 05/12/2022   Generalized anxiety disorder 02/25/2020   Overweight child 11/17/2013   Allergic rhinitis     Allergies:  Allergies  Allergen Reactions   Amoxicillin Rash and Hives   Medications:  Current Outpatient Medications:    triamcinolone cream (KENALOG) 0.1 %, Apply 1 Application topically 4 (four) times daily., Disp: 80 g, Rfl: 0   cefdinir (OMNICEF) 300 MG capsule, Take 1 capsule (300 mg total) by mouth 2 (two) times daily., Disp: 14  capsule, Rfl: 0   cetirizine (ZYRTEC) 10 MG tablet, Take 1 tablet (10 mg total) by mouth daily., Disp: 30 tablet, Rfl: 5   FLUoxetine (PROZAC) 20 MG capsule, Take 1 capsule (20 mg total) by mouth daily., Disp: 90 capsule, Rfl: 1   fluticasone (FLONASE) 50 MCG/ACT nasal spray, Place 2 sprays into both nostrils daily., Disp:  16 g, Rfl: 0   norgestimate-ethinyl estradiol (ORTHO-CYCLEN) 0.25-35 MG-MCG tablet, Take 1 tablet by mouth daily., Disp: 84 tablet, Rfl: 1  Observations/Objective: Patient is well-developed, well-nourished in no acute distress.  Resting comfortably at home.  Head is normocephalic, atraumatic.  No labored breathing.  Speech is clear and coherent with logical content.  Patient is alert and oriented at baseline.  Erythematous papulovesicular rash on erythematous base that is pruritic noted on dorsal right hand and lateral aspect of 3rd finger on right hand  Assessment and Plan: 1. Dyshidrotic eczema - triamcinolone cream (KENALOG) 0.1 %; Apply 1 Application topically 4 (four) times daily.  Dispense: 80 g; Refill: 0  - Suspect Dyshidrotic eczema - Advised to add Triamcinolone cream three to four times daily - Add good lotion/moisturizer such as Aquaphor, Eucerin, Lubriderm in a thick layer at bedtime and cover with cotton covering, like cotton socks or gloves - Seek in person evaluation if continues to worsen  Follow Up Instructions: I discussed the assessment and treatment plan with the patient. The patient was provided an opportunity to ask questions and all were answered. The patient agreed with the plan and demonstrated an understanding of the instructions.  A copy of instructions were sent to the patient via MyChart unless otherwise noted below.    The patient was advised to call back or seek an in-person evaluation if the symptoms worsen or if the condition fails to improve as anticipated.  Time:  I spent 10 minutes with the patient via telehealth technology discussing the above problems/concerns.    Mar Daring, PA-C

## 2023-02-16 ENCOUNTER — Encounter: Payer: Self-pay | Admitting: Family Medicine

## 2023-03-03 ENCOUNTER — Encounter: Payer: Self-pay | Admitting: Emergency Medicine

## 2023-03-03 ENCOUNTER — Ambulatory Visit
Admission: EM | Admit: 2023-03-03 | Discharge: 2023-03-03 | Disposition: A | Discharge status: Home / Self Care | Payer: Commercial Managed Care - PPO | Attending: Family Medicine | Admitting: Family Medicine

## 2023-03-03 ENCOUNTER — Ambulatory Visit (INDEPENDENT_AMBULATORY_CARE_PROVIDER_SITE_OTHER): Payer: Commercial Managed Care - PPO

## 2023-03-03 DIAGNOSIS — M25531 Pain in right wrist: Secondary | ICD-10-CM

## 2023-03-03 DIAGNOSIS — W19XXXA Unspecified fall, initial encounter: Secondary | ICD-10-CM

## 2023-03-03 MED ORDER — IBUPROFEN 800 MG PO TABS: 800.0000 mg | ORAL_TABLET | Freq: Three times a day (TID) | ORAL | 0 refills | Status: AC | PRN

## 2023-03-03 NOTE — Discharge Instructions (Signed)
The x-ray did not show any broken bones.  Take ibuprofen 800 mg--1 tab every 8 hours as needed for pain.  Call the hand specialist if you are not improving.

## 2023-03-03 NOTE — ED Triage Notes (Signed)
Describes right wrist pain after catching herself while falling last night. Reports pain and weakness in wrist, worse with movement, pain described as shooting. No obvious visible deformity or gross swelling observed in triage.

## 2023-03-03 NOTE — ED Provider Notes (Addendum)
[facility]    CSN: [id number] Arrival date & time: 03/03/23  1535      History   Chief Complaint Chief Complaint  Patient presents with   Fall   Wrist Pain    HPI Brittney Green is a 22 y.o. female.    Fall  Wrist Pain   Here for right wrist pain.  Overnight she fell forward onto her right hand and now has pain in her right wrist.    Past Medical History:  Diagnosis Date   Allergic rhinitis    Allergy    Anxiety    [employer] 03/03/2021   Asthma    reactive airway disease as infant.   Bronchospasm     Patient Active Problem List   Diagnosis Date Noted   Recurrent acute suppurative otitis media without spontaneous rupture of tympanic membrane of both sides 05/12/2022   Generalized anxiety disorder 02/25/2020   Overweight child 11/17/2013   Allergic rhinitis     Past Surgical History:  Procedure Laterality Date   tubes in ears      OB History   No obstetric history on file.      Home Medications    Prior to Admission medications   Medication Sig Start Date End Date Taking? Authorizing Provider  FLUoxetine (PROZAC) 20 MG capsule Take 1 capsule (20 mg total) by mouth daily. 07/06/22  Yes Dorna Mai, MD  ibuprofen (ADVIL) 800 MG tablet Take 1 tablet (800 mg total) by mouth every 8 (eight) hours as needed (pain). 03/03/23  Yes Barrett Henle, MD  norgestimate-ethinyl estradiol (ORTHO-CYCLEN) 0.25-35 MG-MCG tablet Take 1 tablet by mouth daily. 07/06/22  Yes Dorna Mai, MD  cetirizine (ZYRTEC) 10 MG tablet Take 1 tablet (10 mg total) by mouth daily. 06/30/22   Dorna Mai, MD  fluticasone [facility]) 50 MCG/ACT nasal spray Place 2 sprays into both nostrils daily. 06/30/22   Dorna Mai, MD  triamcinolone cream (KENALOG) 0.1 % Apply 1 Application topically 4 (four) times daily. 10/17/22   Mar Daring, PA-C    Family History Family History  Problem Relation Age of Onset   Asthma Mother    COPD Maternal Grandmother     Emphysema Maternal Grandmother    Diabetes Maternal Grandfather    Depression Sister    Alcohol abuse Maternal Aunt        per patient history form drug abuse    Social History Social History   Tobacco Use   Smoking status: Never   Smokeless tobacco: Never  Vaping Use   Vaping Use: Never used  Substance Use Topics   Alcohol use: No   Drug use: No     Allergies   Amoxicillin   Review of Systems Review of Systems   Physical Exam Triage Vital Signs ED Triage Vitals  Enc Vitals Group     BP 03/03/23 1633 120/83     Pulse Rate 03/03/23 1633 97     Resp 03/03/23 1633 16     Temp 03/03/23 1633 98.1 F (36.7 C)     Temp Source 03/03/23 1633 Oral     SpO2 03/03/23 1633 99 %     Weight --      Height --      Head Circumference --      Peak Flow --      Pain Score 03/03/23 1636 8     Pain Loc --      Pain Edu? --      Excl. in  GC? --    No data found.  Updated Vital Signs BP 120/83 (BP Location: Left Arm)   Pulse 97   Temp 98.1 F (36.7 C) (Oral)   Resp 16   LMP 02/01/2023   SpO2 99%   Visual Acuity Right Eye Distance:   Left Eye Distance:   Bilateral Distance:    Right Eye Near:   Left Eye Near:    Bilateral Near:     Physical Exam Vitals reviewed.  Constitutional:      General: She is not in acute distress.    Appearance: She is not toxic-appearing.  Musculoskeletal:     Comments: There is tenderness over the dorsal and ventral surfaces of the right wrist, more so on the ventral surface.  Skin:    Coloration: Skin is not pale.  Neurological:     Mental Status: She is oriented to person, place, and time.  Psychiatric:        Behavior: Behavior normal.      UC Treatments / Results  Labs (all labs ordered are listed, but only abnormal results are displayed) Labs Reviewed - No data to display  EKG   Radiology DG Wrist Complete Right  Result Date: 03/03/2023 CLINICAL DATA:  Fall on outstretched hand EXAM: RIGHT WRIST - COMPLETE 3+  VIEW COMPARISON:  None Available. FINDINGS: There is no evidence of fracture or dislocation. There is no evidence of arthropathy or other focal bone abnormality. Soft tissues are unremarkable. IMPRESSION: Negative. Electronically Signed   By: Donavan Foil M.D.   On: 03/03/2023 16:48    Procedures Procedures (including critical care time)  Medications Ordered in UC Medications - No data to display  Initial Impression / Assessment and Plan / UC Course  I have reviewed the triage vital signs and the nursing notes.  Pertinent labs & imaging results that were available during my care of the patient were reviewed by me and considered in my medical decision making (see chart for details).        X-rays negative.  Wrist brace is supplied and ibuprofen is sent in for pain.  She states she is taking birth control pills that make her periods irregular. Final Clinical Impressions(s) / UC Diagnoses   Final diagnoses:  Right wrist pain     Discharge Instructions      The x-ray did not show any broken bones.  Take ibuprofen 800 mg--1 tab every 8 hours as needed for pain.  Call the hand specialist if you are not improving.     ED Prescriptions     Medication Sig Dispense Auth. Provider   ibuprofen (ADVIL) 800 MG tablet Take 1 tablet (800 mg total) by mouth every 8 (eight) hours as needed (pain). 21 tablet Davidlee Jeanbaptiste, Gwenlyn Perking, MD      PDMP not reviewed this encounter.   Barrett Henle, MD 03/03/23 1659    Barrett Henle, MD 03/03/23 1700

## 2023-05-18 ENCOUNTER — Other Ambulatory Visit: Payer: Self-pay | Admitting: Family Medicine

## 2023-05-19 NOTE — Telephone Encounter (Signed)
Courtesy refill. Patient will need an office visit for further refills. 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, unspecified otitis media location   [facility], MD   1 year ago Encounter for initial prescription of contraceptive pills   [facility], MD   1 year ago COVID-19   [facility], Gildardo Pounds, NP   2 years ago Generalized anxiety disorder   [facility], Kandee Keen, MD               norgestimate-ethinyl estradiol (ORTHO-CYCLEN) 0.25-35 MG-MCG tablet [Pharmacy Med Name: Alveria Apley 0.25-0.035 MG] 84 tablet 0    Sig: TAKE 1 TABLET BY MOUTH EVERY DAY     OB/GYN:  Contraceptives Passed - 05/18/2023 11:36 AM      Passed - Last BP in normal range    BP Readings from Last 1 Encounters:  03/03/23 120/83         Passed - Valid encounter within last 12 months    Recent Outpatient Visits           10 months ago Anxiety and depression   [facility], MD   1 year ago Chronic suppurative otitis media of right ear, unspecified otitis media location   [facility], MD   1 year ago Encounter for initial prescription of contraceptive pills   [facility], MD   1 year ago COVID-19   [facility], Gildardo Pounds, NP   2 years ago Generalized anxiety disorder   [facility], Kandee Keen, MD              Passed - Patient is not a smoker

## 2023-05-29 ENCOUNTER — Encounter: Payer: Self-pay | Admitting: Emergency Medicine

## 2023-05-29 ENCOUNTER — Other Ambulatory Visit: Payer: Self-pay

## 2023-05-29 ENCOUNTER — Ambulatory Visit
Admission: EM | Admit: 2023-05-29 | Discharge: 2023-05-29 | Disposition: A | Discharge status: Home / Self Care | Payer: Commercial Managed Care - PPO | Attending: Internal Medicine | Admitting: Internal Medicine

## 2023-05-29 DIAGNOSIS — J069 Acute upper respiratory infection, unspecified: Secondary | ICD-10-CM | POA: Insufficient documentation

## 2023-05-29 DIAGNOSIS — R051 Acute cough: Secondary | ICD-10-CM | POA: Diagnosis not present

## 2023-05-29 DIAGNOSIS — H65191 Other acute nonsuppurative otitis media, right ear: Secondary | ICD-10-CM | POA: Insufficient documentation

## 2023-05-29 DIAGNOSIS — J029 Acute pharyngitis, unspecified: Secondary | ICD-10-CM | POA: Diagnosis present

## 2023-05-29 LAB — POCT RAPID STREP A (OFFICE): Rapid Strep A Screen: NEGATIVE

## 2023-05-29 MED ORDER — CEFDINIR 300 MG PO CAPS: 300.0000 mg | ORAL_CAPSULE | Freq: Two times a day (BID) | ORAL | 0 refills | Status: AC

## 2023-05-29 NOTE — ED Triage Notes (Signed)
Pt here for sore throat X 1 week.  Reports seeing white spots on tonsils. No fever per pt.  Hurts with talking and swallowing

## 2023-05-29 NOTE — Discharge Instructions (Signed)
I have prescribed an antibiotic for symptoms and right ear infection.  Follow-up if any symptoms persist or worsen.

## 2023-05-29 NOTE — ED Provider Notes (Signed)
[facility]    CSN: [id number] Arrival date & time: 05/29/23  1534      History   Chief Complaint Chief Complaint  Patient presents with   Sore Throat    HPI Brittney Green is a 22 y.o. female.   Patient presents with sore throat, nasal congestion, right ear pain, cough that started 5 days ago.  Patient denies any fever or known sick contacts.  Patient is taking DayQuil for symptoms with minimal improvement.  Denies chest pain or shortness of breath.   Sore Throat    Past Medical History:  Diagnosis Date   Allergic rhinitis    Allergy    Anxiety    Phreesia 03/03/2021   Asthma    reactive airway disease as infant.   Bronchospasm     Patient Active Problem List   Diagnosis Date Noted   Recurrent acute suppurative otitis media without spontaneous rupture of tympanic membrane of both sides 05/12/2022   Generalized anxiety disorder 02/25/2020   Overweight child 11/17/2013   Allergic rhinitis     Past Surgical History:  Procedure Laterality Date   tubes in ears      OB History   No obstetric history on file.      Home Medications    Prior to Admission medications   Medication Sig Start Date End Date Taking? Authorizing Provider  cefdinir (OMNICEF) 300 MG capsule Take 1 capsule (300 mg total) by mouth 2 (two) times daily for 10 days. 05/29/23 06/08/23 Yes Ryken Paschal, Acie Fredrickson, FNP  cetirizine (ZYRTEC) 10 MG tablet Take 1 tablet (10 mg total) by mouth daily. 06/30/22   Georganna Skeans, MD  FLUoxetine (PROZAC) 20 MG capsule TAKE 1 CAPSULE BY MOUTH EVERY DAY 05/19/23   Georganna Skeans, MD  fluticasone [facility]) 50 MCG/ACT nasal spray Place 2 sprays into both nostrils daily. 06/30/22   Georganna Skeans, MD  ibuprofen (ADVIL) 800 MG tablet Take 1 tablet (800 mg total) by mouth every 8 (eight) hours as needed (pain). 03/03/23   Zenia Resides, MD  norgestimate-ethinyl estradiol (ORTHO-CYCLEN) 0.25-35 MG-MCG tablet TAKE 1 TABLET BY MOUTH EVERY DAY 05/19/23    Georganna Skeans, MD  triamcinolone cream (KENALOG) 0.1 % Apply 1 Application topically 4 (four) times daily. 10/17/22   Margaretann Loveless, PA-C    Family History Family History  Problem Relation Age of Onset   Asthma Mother    COPD Maternal Grandmother    Emphysema Maternal Grandmother    Diabetes Maternal Grandfather    Depression Sister    Alcohol abuse Maternal Aunt        per patient history form drug abuse    Social History Social History   Tobacco Use   Smoking status: Never   Smokeless tobacco: Never  Vaping Use   Vaping Use: Never used  Substance Use Topics   Alcohol use: No   Drug use: No     Allergies   Amoxicillin   Review of Systems Review of Systems Per HPI  Physical Exam Triage Vital Signs ED Triage Vitals [05/29/23 1743]  Enc Vitals Group     BP 119/83     Pulse Rate 88     Resp 18     Temp 98.4 F (36.9 C)     Temp Source Oral     SpO2 98 %     Weight 210 lb (95.3 kg)     Height 5\' 4"  (1.626 m)     Head Circumference  Peak Flow      Pain Score 8     Pain Loc      Pain Edu?      Excl. in GC?    No data found.  Updated Vital Signs BP 119/83   Pulse 88   Temp 98.4 F (36.9 C) (Oral)   Resp 18   Ht 5\' 4"  (1.626 m)   Wt 210 lb (95.3 kg)   LMP 05/12/2023   SpO2 98%   BMI 36.05 kg/m   Visual Acuity Right Eye Distance:   Left Eye Distance:   Bilateral Distance:    Right Eye Near:   Left Eye Near:    Bilateral Near:     Physical Exam Constitutional:      General: She is not in acute distress.    Appearance: Normal appearance. She is not toxic-appearing or diaphoretic.  HENT:     Head: Normocephalic and atraumatic.     Right Ear: Ear canal normal. Tympanic membrane is erythematous. Tympanic membrane is not perforated or bulging.     Left Ear: Tympanic membrane and ear canal normal.     Nose: Congestion present.     Mouth/Throat:     Mouth: Mucous membranes are moist.     Pharynx: Posterior oropharyngeal erythema  present.  Eyes:     Extraocular Movements: Extraocular movements intact.     Conjunctiva/sclera: Conjunctivae normal.     Pupils: Pupils are equal, round, and reactive to light.  Cardiovascular:     Rate and Rhythm: Normal rate and regular rhythm.     Pulses: Normal pulses.     Heart sounds: Normal heart sounds.  Pulmonary:     Effort: Pulmonary effort is normal. No respiratory distress.     Breath sounds: Normal breath sounds. No stridor. No wheezing, rhonchi or rales.  Abdominal:     General: Abdomen is flat. Bowel sounds are normal.     Palpations: Abdomen is soft.  Musculoskeletal:        General: Normal range of motion.     Cervical back: Normal range of motion.  Skin:    General: Skin is warm and dry.  Neurological:     General: No focal deficit present.     Mental Status: She is alert and oriented to person, place, and time. Mental status is at baseline.  Psychiatric:        Mood and Affect: Mood normal.        Behavior: Behavior normal.      UC Treatments / Results  Labs (all labs ordered are listed, but only abnormal results are displayed) Labs Reviewed  CULTURE, GROUP A STREP [facility])  POCT RAPID STREP A (OFFICE)    EKG   Radiology No results found.  Procedures Procedures (including critical care time)  Medications Ordered in UC Medications - No data to display  Initial Impression / Assessment and Plan / UC Course  I have reviewed the triage vital signs and the nursing notes.  Pertinent labs & imaging results that were available during my care of the patient were reviewed by me and considered in my medical decision making (see chart for details).     Rapid strep is negative but given appearance of posterior pharynx on exam and concern for right otitis media, will opt to treat with antibiotic therapy today.  Patient is allergic to amoxicillin but has taken cephalosporins previously and tolerated well.  Therefore, cefdinir was prescribed.  Symptoms could  be viral in etiology and  supportive care related to this was discussed with patient.  Advised strict follow-up precautions.  Patient verbalized understanding and was agreeable with plan. Final Clinical Impressions(s) / UC Diagnoses   Final diagnoses:  Sore throat  Acute upper respiratory infection  Other non-recurrent acute nonsuppurative otitis media of right ear  Acute cough     Discharge Instructions      I have prescribed an antibiotic for symptoms and right ear infection.  Follow-up if any symptoms persist or worsen.    ED Prescriptions     Medication Sig Dispense Auth. Provider   cefdinir (OMNICEF) 300 MG capsule Take 1 capsule (300 mg total) by mouth 2 (two) times daily for 10 days. 20 capsule Gustavus Bryant, [place]      PDMP not reviewed this encounter.   Gustavus Bryant, [place] 05/29/23 [phone]

## 2023-06-01 LAB — CULTURE, GROUP A STREP (THRC)

## 2023-09-01 ENCOUNTER — Telehealth: Payer: Self-pay | Admitting: Family Medicine

## 2023-09-01 NOTE — Telephone Encounter (Signed)
Called pt to schedule appt requested via MyChart for ear infection; provider does not have any available appts until 10/02. Advised pt to UC

## 2023-10-14 ENCOUNTER — Other Ambulatory Visit: Payer: Self-pay

## 2023-10-14 ENCOUNTER — Ambulatory Visit
Admission: EM | Admit: 2023-10-14 | Discharge: 2023-10-14 | Disposition: A | Discharge status: Home / Self Care | Payer: Commercial Managed Care - PPO

## 2023-10-14 DIAGNOSIS — M62830 Muscle spasm of back: Secondary | ICD-10-CM | POA: Diagnosis not present

## 2023-10-14 MED ORDER — CYCLOBENZAPRINE HCL 10 MG PO TABS: 10.0000 mg | ORAL_TABLET | Freq: Two times a day (BID) | ORAL | 0 refills | Status: AC | PRN

## 2023-10-14 MED ORDER — PREDNISONE 20 MG PO TABS: 40.0000 mg | ORAL_TABLET | Freq: Every day | ORAL | 0 refills | Status: AC

## 2023-10-14 NOTE — ED Triage Notes (Signed)
Patient presents to UC for back pain and neck pain x 1 week. Woke up with neck pain. Pain worse last night. Treating with tylenol.   Denies injury.

## 2023-10-16 ENCOUNTER — Telehealth: Payer: Self-pay | Admitting: Family Medicine

## 2023-10-16 NOTE — Telephone Encounter (Signed)
Called pt and could not reach, could not leave vm. Calling to schedule appt requested via mychart for neck tightness.

## 2023-11-11 ENCOUNTER — Encounter: Payer: Self-pay | Admitting: Physician Assistant

## 2023-11-11 NOTE — ED Provider Notes (Signed)
[facility]    CSN: [id number] Arrival date & time: 10/14/23  1404      History   Chief Complaint Chief Complaint  Patient presents with   Back Pain    HPI Brittney Green is a 22 y.o. female.   Patient here today for evaluation of back pain and neck pain that started about 1 week ago.  She states she woke up with the neck pain.  Pain seemed to worsen last night.  She has tried Tylenol without resolution.  She denies any known injury.  She states that pain is more present to the left upper back and left side of her neck.  Certain movements worsen pain.  She denies any numbness or tingling.  The history is provided by the patient.  Back Pain Associated symptoms: no abdominal pain and no fever     Past Medical History:  Diagnosis Date   Allergic rhinitis    Allergy    Anxiety    Phreesia 03/03/2021   Asthma    reactive airway disease as infant.   Bronchospasm     Patient Active Problem List   Diagnosis Date Noted   Recurrent acute suppurative otitis media without spontaneous rupture of tympanic membrane of both sides 05/12/2022   Generalized anxiety disorder 02/25/2020   Overweight child 11/17/2013   Allergic rhinitis     Past Surgical History:  Procedure Laterality Date   tubes in ears      OB History   No obstetric history on file.      Home Medications    Prior to Admission medications   Medication Sig Start Date End Date Taking? Authorizing Provider  cyclobenzaprine (FLEXERIL) 10 MG tablet Take 1 tablet (10 mg total) by mouth 2 (two) times daily as needed for muscle spasms. 10/14/23  Yes Tomi Bamberger, PA-C  fluticasone (FLONASE) 50 MCG/ACT nasal spray Place 2 sprays into both nostrils daily. 03/22/22  Yes [provider]  cetirizine (ZYRTEC) 10 MG tablet Take 1 tablet (10 mg total) by mouth daily. 06/30/22   Georganna Skeans, MD  citalopram (CELEXA) 20 MG tablet Take 20 mg by mouth daily.    [provider]  FLUoxetine  (PROZAC) 20 MG capsule TAKE 1 CAPSULE BY MOUTH EVERY DAY 05/19/23   Georganna Skeans, MD  FLUoxetine (PROZAC) 20 MG capsule Take 20 mg by mouth daily.    [provider]  fluticasone (FLONASE) 50 MCG/ACT nasal spray Place 2 sprays into both nostrils daily. 06/30/22   Georganna Skeans, MD  ibuprofen (ADVIL) 800 MG tablet Take 1 tablet (800 mg total) by mouth every 8 (eight) hours as needed (pain). 03/03/23   Zenia Resides, MD  norgestimate-ethinyl estradiol (ORTHO-CYCLEN) 0.25-35 MG-MCG tablet TAKE 1 TABLET BY MOUTH EVERY DAY 05/19/23   Georganna Skeans, MD  triamcinolone cream (KENALOG) 0.1 % Apply 1 Application topically 4 (four) times daily. 10/17/22   Margaretann Loveless, PA-C    Family History Family History  Problem Relation Age of Onset   Asthma Mother    COPD Maternal Grandmother    Emphysema Maternal Grandmother    Diabetes Maternal Grandfather    Depression Sister    Alcohol abuse Maternal Aunt        per patient history form drug abuse    Social History Social History   Tobacco Use   Smoking status: Never   Smokeless tobacco: Never  Vaping Use   Vaping status: Never Used  Substance Use Topics   Alcohol use:  No   Drug use: No     Allergies   Amoxicillin   Review of Systems Review of Systems  Constitutional:  Negative for chills and fever.  Eyes:  Negative for discharge and redness.  Gastrointestinal:  Negative for abdominal pain, nausea and vomiting.  Musculoskeletal:  Positive for back pain, myalgias and neck pain.     Physical Exam Triage Vital Signs ED Triage Vitals  Encounter Vitals Group     BP 10/14/23 1432 99/72     Systolic BP Percentile --      Diastolic BP Percentile --      Pulse Rate 10/14/23 1432 98     Resp 10/14/23 1432 16     Temp 10/14/23 1432 98.5 F (36.9 C)     Temp Source 10/14/23 1432 Oral     SpO2 10/14/23 1432 98 %     Weight --      Height --      Head Circumference --      Peak Flow --      Pain Score 10/14/23  1433 7     Pain Loc --      Pain Education --      Exclude from Growth Chart --    No data found.  Updated Vital Signs BP 99/72 (BP Location: Left Arm)   Pulse 98   Temp 98.5 F (36.9 C) (Oral)   Resp 16   LMP 09/21/2023 (Exact Date)   SpO2 98%   Visual Acuity Right Eye Distance:   Left Eye Distance:   Bilateral Distance:    Right Eye Near:   Left Eye Near:    Bilateral Near:     Physical Exam Vitals and nursing note reviewed.  Constitutional:      General: She is not in acute distress.    Appearance: Normal appearance. She is not ill-appearing.  HENT:     Head: Normocephalic and atraumatic.  Eyes:     Conjunctiva/sclera: Conjunctivae normal.  Neck:     Comments: Full range of motion of neck with mild tenderness palpation noted to left trapezius area with spasm noted to same. Cardiovascular:     Rate and Rhythm: Normal rate.  Pulmonary:     Effort: Pulmonary effort is normal. No respiratory distress.  Neurological:     Mental Status: She is alert.  Psychiatric:        Mood and Affect: Mood normal.        Behavior: Behavior normal.        Thought Content: Thought content normal.      UC Treatments / Results  Labs (all labs ordered are listed, but only abnormal results are displayed) Labs Reviewed - No data to display  EKG   Radiology No results found.  Procedures Procedures (including critical care time)  Medications Ordered in UC Medications - No data to display  Initial Impression / Assessment and Plan / UC Course  I have reviewed the triage vital signs and the nursing notes.  Pertinent labs & imaging results that were available during my care of the patient were reviewed by me and considered in my medical decision making (see chart for details).    Suspect acute trapezius strain and recommended treatment with steroid burst and muscle relaxer.  Advised muscle relaxer may cause drowsiness and advised to use with caution.  Encouraged follow-up  if no gradual improvement or with any further concerns.  Final Clinical Impressions(s) / UC Diagnoses   Final diagnoses:  Spasm  of left trapezius muscle   Discharge Instructions   None    ED Prescriptions     Medication Sig Dispense Auth. Provider   cyclobenzaprine (FLEXERIL) 10 MG tablet Take 1 tablet (10 mg total) by mouth 2 (two) times daily as needed for muscle spasms. 20 tablet Erma Pinto F, PA-C   predniSONE (DELTASONE) 20 MG tablet Take 2 tablets (40 mg total) by mouth daily with breakfast for 5 days. 10 tablet Tomi Bamberger, PA-C      PDMP not reviewed this encounter.   Tomi Bamberger, PA-C 11/11/23 [phone]

## 2024-03-15 ENCOUNTER — Encounter: Payer: Self-pay | Admitting: Family Medicine

## 2024-03-15 ENCOUNTER — Ambulatory Visit (INDEPENDENT_AMBULATORY_CARE_PROVIDER_SITE_OTHER): Admitting: Family Medicine

## 2024-03-15 VITALS — BP 116/80 | HR 95 | Temp 98.8°F | Resp 16 | Ht 64.0 in | Wt 216.8 lb

## 2024-03-15 DIAGNOSIS — Z30013 Encounter for initial prescription of injectable contraceptive: Secondary | ICD-10-CM | POA: Diagnosis not present

## 2024-03-15 NOTE — Progress Notes (Signed)
 Established Patient Office Visit  Subjective    Patient ID: Brittney Green, female    DOB: [birth year]  Age: 23 y.o. MRN: [id number]  CC: No chief complaint on file.   HPI Brittney Green presents for desiring a different birth control she reports that she does not do well trying to remember the pills. She believes she would like to try the depo.   Outpatient Encounter Medications as of 03/15/2024  Medication Sig   cetirizine (ZYRTEC) 10 MG tablet Take 1 tablet (10 mg total) by mouth daily.   citalopram (CELEXA) 20 MG tablet Take 20 mg by mouth daily.   cyclobenzaprine (FLEXERIL) 10 MG tablet Take 1 tablet (10 mg total) by mouth 2 (two) times daily as needed for muscle spasms.   FLUoxetine (PROZAC) 20 MG capsule TAKE 1 CAPSULE BY MOUTH EVERY DAY   fluticasone (FLONASE) 50 MCG/ACT nasal spray Place 2 sprays into both nostrils daily.   ibuprofen (ADVIL) 800 MG tablet Take 1 tablet (800 mg total) by mouth every 8 (eight) hours as needed (pain).   triamcinolone cream (KENALOG) 0.1 % Apply 1 Application topically 4 (four) times daily.   FLUoxetine (PROZAC) 20 MG capsule Take 20 mg by mouth daily. (Patient not taking: Reported on 03/15/2024)   fluticasone (FLONASE) 50 MCG/ACT nasal spray Place 2 sprays into both nostrils daily. (Patient not taking: Reported on 03/15/2024)   norgestimate-ethinyl estradiol (ORTHO-CYCLEN) 0.25-35 MG-MCG tablet TAKE 1 TABLET BY MOUTH EVERY DAY (Patient not taking: Reported on 03/15/2024)   No facility-administered encounter medications on file as of 03/15/2024.    Past Medical History:  Diagnosis Date   Allergic rhinitis    Allergy    Anxiety    Phreesia 03/03/2021   Asthma    reactive airway disease as infant.   Bronchospasm     Past Surgical History:  Procedure Laterality Date   tubes in ears      Family History  Problem Relation Age of Onset   Asthma Mother    COPD Maternal Grandmother    Emphysema Maternal Grandmother    Diabetes Maternal  Grandfather    Depression Sister    Alcohol abuse Maternal Aunt        per patient history form drug abuse    Social History   Socioeconomic History   Marital status: Single    Spouse name: Not on file   Number of children: 0   Years of education: Not on file   Highest education level: Not on file  Occupational History   Occupation: student  Tobacco Use   Smoking status: Never   Smokeless tobacco: Never  Vaping Use   Vaping status: Never Used  Substance and Sexual Activity   Alcohol use: No   Drug use: No   Sexual activity: Never  Other Topics Concern   Not on file  Social History Narrative   Lives: Patient lives with mother, sister, grandfather; Dad in [place]; rare visits with Dad last visit in 5th grade.      Education: 8th grader; [employer]; favorite subject is math; no held back or failing; excellent student; no behavior issues.   Career goals:  Unsure yet.     Tobacco: no smokers in the home.      Pets:  none      Seatbelt: 100%      Bedtime in 2017 at 10:00pm; has cell phone.  Wakes up 7:00am.        Dental hygiene: brushes teeth once daily.  No  nighttime brushing.  No flossing.      Activities: plays softball and basketball; skating; watches YouTube.        Punishment: rarely gets punished.  Depends on severity of issue; phone taken away .      Helmets:  No helmet.    Social Drivers of Corporate investment banker Strain: Low Risk  (03/15/2024)   Overall Financial Resource Strain (CARDIA)    Difficulty of Paying Living Expenses: Not hard at all  Food Insecurity: No Food Insecurity (03/15/2024)   Hunger Vital Sign    Worried About Running Out of Food in the Last Year: Never true    Ran Out of Food in the Last Year: Never true  Transportation Needs: No Transportation Needs (03/15/2024)   PRAPARE - Administrator, Civil Service (Medical): No    Lack of Transportation (Non-Medical): No  Physical Activity: Insufficiently Active (03/15/2024)   Exercise  Vital Sign    Days of Exercise per Week: 4 days    Minutes of Exercise per Session: 30 min  Stress: No Stress Concern Present (03/15/2024)   [employer] of Occupational Health - Occupational Stress Questionnaire    Feeling of Stress : Not at all  Social Connections: Socially Isolated (03/15/2024)   Social Connection and Isolation Panel [NHANES]    Frequency of Communication with Friends and Family: Three times a week    Frequency of Social Gatherings with Friends and Family: Twice a week    Attends Religious Services: Never    Database administrator or Organizations: No    Attends Banker Meetings: Never    Marital Status: Never married  Intimate Partner Violence: Not At Risk (03/15/2024)   Humiliation, Afraid, Rape, and Kick questionnaire    Fear of Current or Ex-Partner: No    Emotionally Abused: No    Physically Abused: No    Sexually Abused: No    Review of Systems  All other systems reviewed and are negative.       Objective    BP 116/80   Pulse 95   Temp 98.8 F (37.1 C) (Oral)   Resp 16   Ht 5\' 4"  (1.626 m)   Wt 216 lb 12.8 oz (98.3 kg)   SpO2 98%   BMI 37.21 kg/m   Physical Exam Vitals and nursing note reviewed.  Constitutional:      General: She is not in acute distress. Cardiovascular:     Rate and Rhythm: Normal rate and regular rhythm.  Pulmonary:     Effort: Pulmonary effort is normal.     Breath sounds: Normal breath sounds.  Abdominal:     Palpations: Abdomen is soft.     Tenderness: There is no abdominal tenderness.  Neurological:     General: No focal deficit present.     Mental Status: She is alert and oriented to person, place, and time.         Assessment & Plan:   Encounter for initial prescription of injectable contraceptive  Patient to return in 7-10 days for injection once next menses begins.    Return in about 3 months (around 06/15/2024) for follow up.   Tommie Raymond, MD

## 2024-03-16 ENCOUNTER — Encounter: Payer: Self-pay | Admitting: Family Medicine

## 2024-03-19 ENCOUNTER — Ambulatory Visit

## 2024-03-19 ENCOUNTER — Other Ambulatory Visit: Payer: Self-pay | Admitting: Family Medicine

## 2024-03-19 ENCOUNTER — Encounter: Payer: Self-pay | Admitting: Family Medicine

## 2024-03-19 DIAGNOSIS — Z3009 Encounter for other general counseling and advice on contraception: Secondary | ICD-10-CM

## 2024-04-04 ENCOUNTER — Ambulatory Visit: Admitting: Family Medicine

## 2024-05-20 ENCOUNTER — Encounter: Payer: Self-pay | Admitting: Obstetrics and Gynecology

## 2024-05-20 ENCOUNTER — Other Ambulatory Visit: Payer: Self-pay

## 2024-05-20 ENCOUNTER — Ambulatory Visit: Admitting: Obstetrics and Gynecology

## 2024-05-20 VITALS — BP 139/82 | HR 103 | Wt 214.6 lb

## 2024-05-20 DIAGNOSIS — Z3009 Encounter for other general counseling and advice on contraception: Secondary | ICD-10-CM

## 2024-05-20 DIAGNOSIS — Z30430 Encounter for insertion of intrauterine contraceptive device: Secondary | ICD-10-CM

## 2024-05-20 DIAGNOSIS — Z3202 Encounter for pregnancy test, result negative: Secondary | ICD-10-CM | POA: Diagnosis not present

## 2024-05-20 LAB — POCT PREGNANCY, URINE: Preg Test, Ur: NEGATIVE

## 2024-05-20 MED ORDER — LEVONORGESTREL 20.1 MCG/DAY IU IUD
1.0000 | INTRAUTERINE_SYSTEM | Freq: Once | INTRAUTERINE | Status: AC
Start: 2024-05-20 — End: 2024-05-20
  Administered 2024-05-20: 1 via INTRAUTERINE

## 2024-05-20 MED ORDER — IBUPROFEN 800 MG PO TABS
800.0000 mg | ORAL_TABLET | Freq: Once | ORAL | Status: AC
Start: 2024-05-20 — End: 2024-05-20
  Administered 2024-05-20: 800 mg via ORAL

## 2024-05-20 NOTE — Progress Notes (Signed)
 GYNECOLOGY OFFICE VISIT NOTE  History:  Brittney Green is a 23 y.o. No obstetric history on file. here today for discussion of birth control options. She would like IUD and would like it to help her periods.   She was on OCPs but didn't like the side effects. Otherwise uses condoms. Last intercourse was 2 months ago. Period was last Thursday.    She denies any abnormal vaginal discharge, bleeding, pelvic pain or other concerns.  The following portions of the patient's history were reviewed and updated as appropriate: allergies, current medications, past family history, past medical history, past social history, past surgical history and problem list.  Review of Systems:  Pertinent items noted in HPI and remainder of comprehensive ROS otherwise negative.  Physical Exam:  BP 139/82 (BP Location: Left Arm, Patient Position: Sitting, Cuff Size: Large)   Pulse (!) 103   Wt 214 lb 9.6 oz (97.3 kg)   SpO2 100%   BMI 36.84 kg/m  CONSTITUTIONAL: Well-developed, well-nourished female in no acute distress.  HEENT:  Normocephalic, atraumatic. External right and left ear normal. No scleral icterus.  NECK: Normal range of motion, supple, no masses noted on observation SKIN: No rash noted. Not diaphoretic. No erythema. No pallor. MUSCULOSKELETAL: Normal range of motion. No edema noted. NEUROLOGIC: Alert and oriented to person, place, and time. Normal muscle tone coordination. No cranial nerve deficit noted. PSYCHIATRIC: Normal mood and affect. Normal behavior. Normal judgment and thought content.  PELVIC: Normal appearing external genitalia; normal urethral meatus; normal appearing vaginal mucosa and cervix.  No abnormal discharge noted. Performed in the presence of a chaperone  Labs and Imaging Results for orders placed or performed in visit on 05/20/24 (from the past week)  Pregnancy, urine POC   Collection Time: 05/20/24 10:28 AM  Result Value Ref Range   Preg Test, Ur NEGATIVE NEGATIVE        GYNECOLOGY OFFICE PROCEDURE NOTE  Brittney Green is a 23 y.o. G0P0000 here for IUD insertion. No GYN concerns.   IUD Insertion Procedure Note Procedure: IUD insertion with Liletta UPT: Negative GC/CT testing: Offered and declines  Patient identified.  Risks, benefits and alternatives of procedure were discussed including irregular bleeding, cramping, infection, malpositioning or misplacement of the IUD outside the uterus which may require further procedure such as laparoscopy. Also discussed >99% contraception efficacy, increased risk of ectopic pregnancy with failure of method.   Emphasized that this did not protect against STIs, condoms recommended during all sexual encounters. Consent signed. Time out performed.   Speculum inserted and cervix visualized, prepped with 3 swabs of betadine. Intracervical block was done with 1% lidocaine at 12/3/6/9 for a total of 8 cc.   Grasped with a single tooth tenaculum. Uterus sounded to 6.5 cm. Cervical dilators used, and IUD then inserted without difficulty per manufacturer's instructions and strings cut to 3 cm below cervical os but when this happened, the scissors didn't cut through the strings and IUD came out with the scissors. I then placed a new IUD and trimmed the strings to 3cm. All instruments removed. Pt tolerated well with minimal pain and bleeding.   Assessment and Plan:  1. Encounter for IUD insertion (Primary) Discussed concerning signs/symptoms and to call if heavy bleeding, severe abdominal pain, or fever in the following 3 weeks. Manufacturer pamphlet/patient information given. Reviewed timing of efficacy for contraception and to use an alternative form of birth control until that time. - ibuprofen  (ADVIL ) tablet 800 mg - levonorgestrel (LILETTA) 20.1 MCG/DAY IUD 1  each  2. General counseling and advice for contraceptive management - Reviewed different types of birth control available: OCPs, vaginal ring, Nexplanon, Depo,  various types of IUDs, permanent sterilization.  We reviewed the advantages and risks of each (particularly risk of VTE with estrogen containing options). We discussed side effects of each. - Reviewed that birth control does not protect against STI. Condoms reduce the risk of transmission but are not 100% effective especially for HSV and HIV. - Patient has tried: OCPs and Condoms - Patient desires: IUD - she requested placement today.    Meds ordered this encounter  Medications   ibuprofen  (ADVIL ) tablet 800 mg   levonorgestrel (LILETTA) 20.1 MCG/DAY IUD 1 each     Routine preventative health maintenance measures emphasized. Please refer to After Visit Summary for other counseling recommendations.   No follow-ups on file.  Lacey Pian, MD, FACOG Obstetrician & Gynecologist, [facility], [facility]

## 2024-08-29 ENCOUNTER — Ambulatory Visit: Payer: Self-pay | Admitting: Family Medicine

## 2024-08-29 NOTE — Telephone Encounter (Signed)
 Pt coming in tomorrow

## 2024-08-29 NOTE — Telephone Encounter (Signed)
 FYI Only or Action Required?: FYI only for provider.  Patient was last seen in primary care on 03/15/2024 by Tanda Bleacher, MD.  Called Nurse Triage reporting Contraception.  Triage Disposition: See PCP Within 2 Weeks  Patient/caregiver understands and will follow disposition?: Yes     Copied from CRM [id number]. Topic: Clinical - Medication Question >> Aug 29, 2024  3:44 PM Myrick T wrote: Reason for CRM: patient called stated her IUD fell out Tuesday and she needs her birth control changed. Please f/u with patient Reason for Disposition  IUD came out (e.g., IUD fell out of vagina)  Answer Assessment - Initial Assessment Questions This RN scheduled pt for an appointment with PCP tomorrow morning.  Pt wants to discuss other birth control options with PCP IUD fell out this past Tues Pt states the string was low and pt states it started feeling weird' and then pt went to go pee and it came out No pieces broke off, fully intact  Protocols used: Contraception - IUD Symptoms and Questions-A-AH

## 2024-08-30 ENCOUNTER — Ambulatory Visit: Admitting: Family Medicine

## 2024-08-31 ENCOUNTER — Emergency Department (HOSPITAL_COMMUNITY)

## 2024-08-31 ENCOUNTER — Encounter (HOSPITAL_COMMUNITY): Payer: Self-pay

## 2024-08-31 ENCOUNTER — Emergency Department (HOSPITAL_COMMUNITY)
Admission: EM | Admit: 2024-08-31 | Discharge: 2024-08-31 | Disposition: A | Discharge status: Home / Self Care | Attending: Emergency Medicine | Admitting: Emergency Medicine

## 2024-08-31 DIAGNOSIS — R519 Headache, unspecified: Secondary | ICD-10-CM | POA: Insufficient documentation

## 2024-08-31 DIAGNOSIS — R202 Paresthesia of skin: Secondary | ICD-10-CM | POA: Diagnosis not present

## 2024-08-31 DIAGNOSIS — J45909 Unspecified asthma, uncomplicated: Secondary | ICD-10-CM | POA: Insufficient documentation

## 2024-08-31 DIAGNOSIS — E876 Hypokalemia: Secondary | ICD-10-CM | POA: Insufficient documentation

## 2024-08-31 LAB — I-STAT CHEM 8, ED
BUN: 5 mg/dL — ABNORMAL LOW (ref 6–20)
Calcium, Ion: 1.04 mmol/L — ABNORMAL LOW (ref 1.15–1.40)
Chloride: 105 mmol/L (ref 98–111)
Creatinine, Ser: 0.8 mg/dL (ref 0.44–1.00)
Glucose, Bld: 97 mg/dL (ref 70–99)
HCT: 42 % (ref 36.0–46.0)
Hemoglobin: 14.3 g/dL (ref 12.0–15.0)
Potassium: 3.2 mmol/L — ABNORMAL LOW (ref 3.5–5.1)
Sodium: 140 mmol/L (ref 135–145)
TCO2: 22 mmol/L (ref 22–32)

## 2024-08-31 LAB — CBC WITH DIFFERENTIAL/PLATELET
Abs Immature Granulocytes: 0.03 K/uL (ref 0.00–0.07)
Basophils Absolute: 0.1 K/uL (ref 0.0–0.1)
Basophils Relative: 1 %
Eosinophils Absolute: 0 K/uL (ref 0.0–0.5)
Eosinophils Relative: 0 %
HCT: 41.6 % (ref 36.0–46.0)
Hemoglobin: 14 g/dL (ref 12.0–15.0)
Immature Granulocytes: 0 %
Lymphocytes Relative: 32 %
Lymphs Abs: 2.9 K/uL (ref 0.7–4.0)
MCH: 30.9 pg (ref 26.0–34.0)
MCHC: 33.7 g/dL (ref 30.0–36.0)
MCV: 91.8 fL (ref 80.0–100.0)
Monocytes Absolute: 0.7 K/uL (ref 0.1–1.0)
Monocytes Relative: 7 %
Neutro Abs: 5.5 K/uL (ref 1.7–7.7)
Neutrophils Relative %: 60 %
Platelets: 554 K/uL — ABNORMAL HIGH (ref 150–400)
RBC: 4.53 MIL/uL (ref 3.87–5.11)
RDW: 11.8 % (ref 11.5–15.5)
WBC: 9.2 K/uL (ref 4.0–10.5)
nRBC: 0 % (ref 0.0–0.2)

## 2024-08-31 LAB — T4, FREE: Free T4: 1.05 ng/dL (ref 0.61–1.12)

## 2024-08-31 LAB — BASIC METABOLIC PANEL WITH GFR
Anion gap: 14 (ref 5–15)
BUN: 5 mg/dL — ABNORMAL LOW (ref 6–20)
CO2: 21 mmol/L — ABNORMAL LOW (ref 22–32)
Calcium: 9.1 mg/dL (ref 8.9–10.3)
Chloride: 103 mmol/L (ref 98–111)
Creatinine, Ser: 0.8 mg/dL (ref 0.44–1.00)
GFR, Estimated: >60 mL/min (ref >60)
Glucose, Bld: 95 mg/dL (ref 70–99)
Potassium: 3.3 mmol/L — ABNORMAL LOW (ref 3.5–5.1)
Sodium: 138 mmol/L (ref 135–145)

## 2024-08-31 LAB — HCG, QUANTITATIVE, PREGNANCY: hCG, Beta Chain, Quant, S: <1 m[IU]/mL (ref <5)

## 2024-08-31 LAB — MAGNESIUM: Magnesium: 2.2 mg/dL (ref 1.7–2.4)

## 2024-08-31 LAB — TSH: TSH: 1.873 u[IU]/mL (ref 0.350–4.500)

## 2024-08-31 MED ORDER — IOHEXOL 350 MG/ML SOLN
75.0000 mL | Freq: Once | INTRAVENOUS | Status: AC | PRN
  Administered 2024-08-31: 75 mL via INTRAVENOUS

## 2024-08-31 NOTE — Discharge Instructions (Signed)
 Thank you for coming to [facility] Emergency Department. You were seen for intermittent headache and numbness. We did an exam, labs, and imaging, and these showed no acute findings.  Please continue to keep a diary of your symptoms and follow-up with your neurologist as scheduled next week.  You can alternate Tylenol and ibuprofen  for your headache.   Do not hesitate to return to the ED or call 911 if you experience: -Worsening symptoms -Nausea vomiting -Facial droop, slurred speech, visual changes, confusion, asymmetric weakness or numbness tingling -Severe headache that you cannot manage at home -Thunderclap headache -Lightheadedness, passing out -Fevers/chills -Anything else that concerns you

## 2024-08-31 NOTE — ED Notes (Signed)
 Spoke with MD, no code stroke at this time.

## 2024-08-31 NOTE — ED Triage Notes (Signed)
 Pt bib POV c/o popping sensation in head with left sided numbness

## 2024-08-31 NOTE — ED Triage Notes (Signed)
 Pt states that she has been having headache for the past 4 weeks, along with numbness, seen at a hospital in [place] and CT done, pt has an appt with a neurologist next week. Pt reports that today headache was worse and she felt a pop now r side of face is numb, LSN 2am, headache is better but numbness remains.

## 2024-08-31 NOTE — ED Provider Notes (Signed)
 [place] EMERGENCY DEPARTMENT AT [facility] Provider Note   CSN: [id number] Arrival date & time: 08/31/24  0334     History  Chief Complaint  Patient presents with   Numbness    Brittney Green is a 23 y.o. female with PMH as listed below who presents with Pt states that she has been having headache for the past 4 weeks, along with intermittent and variable numbness. Pt reports that today headache was worse and she felt a pop now r side of face is numb, LSN 2am.  She reports her headache is 8 out of 10 currently without any visual symptoms or nausea or vomiting.  She declines any medications to treat this, stating that she already took an ibuprofen  earlier today.  She states that sometimes both of her hands and arms go numb, sometimes just her head.  Sometimes she feels like she is numb inside her head with a worm inside.  She denies any visual changes, asymmetric weakness but endorses photophobia.  She endorses intermittent lightheadedness as well but no loss of consciousness.  No chest pain or palpitations.  No changes in medication.  Grandmother at bedside requesting Lyme disease test.  She states she has been seen twice for this and the first time had a CT scan which was reassuring but the second time she states they  did not do anything and they would not listen.  She relays that she has an appointment with neurology scheduled for next week but has not yet seen a neurologist..  She has been keeping a diary of her symptoms.   Past Medical History:  Diagnosis Date   Allergic rhinitis    Allergy    Anxiety    Phreesia 03/03/2021   Asthma    reactive airway disease as infant.   Bronchospasm        Home Medications Prior to Admission medications   Medication Sig Start Date End Date Taking? Authorizing Provider  cyclobenzaprine  (FLEXERIL ) 10 MG tablet Take 1 tablet (10 mg total) by mouth 2 (two) times daily as needed for muscle spasms. 10/14/23   Billy Asberry FALCON,  PA-C  ibuprofen  (ADVIL ) 800 MG tablet Take 1 tablet (800 mg total) by mouth every 8 (eight) hours as needed (pain). 03/03/23   Vonna Sharlet POUR, MD      Allergies    Amoxicillin     Review of Systems   Review of Systems A 10 point review of systems was performed and is negative unless otherwise reported in HPI.  Physical Exam Updated Vital Signs BP (!) 140/91 (BP Location: Right Arm)   Pulse 86   Temp 98.2 F (36.8 C)   Resp 20   SpO2 99%  Physical Exam General: Normal appearing female, lying in bed.  HEENT: NCAT PERRLA, EOMI, no nystagmus sclera anicteric, MMM, trachea midline temperature is midline,.  Cardiology: RRR, no murmurs/rubs/gallops Resp: Normal respiratory rate and effort. CTAB, no wheezes, rhonchi, crackles.  Abd: Soft, non-tender, non-distended. No rebound tenderness or guarding.  GU: Deferred. MSK: No peripheral edema or signs of trauma. Extremities without deformity or TTP. No cyanosis or clubbing. Skin: warm, dry Neuro: A&Ox4, CNs II-XII grossly intact. 5/5 strength all extremities. Endorses decreased sensation in her R face to light touch but otherwise is intact.  Normal speech Psych: Normal mood and affect.   ED Results / Procedures / Treatments   Labs (all labs ordered are listed, but only abnormal results are displayed) Labs Reviewed  CBC WITH DIFFERENTIAL/PLATELET - Abnormal;  Notable for the following components:      Result Value   Platelets 554 (*)    All other components within normal limits  BASIC METABOLIC PANEL WITH GFR - Abnormal; Notable for the following components:   Potassium 3.3 (*)    CO2 21 (*)    BUN 5 (*)    All other components within normal limits  I-STAT CHEM 8, ED - Abnormal; Notable for the following components:   Potassium 3.2 (*)    BUN 5 (*)    Calcium, Ion 1.04 (*)    All other components within normal limits  MAGNESIUM  TSH  T4, FREE  HCG, QUANTITATIVE, PREGNANCY  LYME DISEASE SEROLOGY W/REFLEX     EKG None  Radiology CT ANGIO HEAD NECK W WO CM Result Date: 08/31/2024 CLINICAL DATA:  23 year old female with persistent headache for 4 weeks, today felt a pop and face is now numb. EXAM: CT ANGIOGRAPHY HEAD AND NECK WITH AND WITHOUT CONTRAST TECHNIQUE: Multidetector CT imaging of the head and neck was performed using the standard protocol during bolus administration of intravenous contrast. Multiplanar CT image reconstructions and MIPs were obtained to evaluate the vascular anatomy. Carotid stenosis measurements (when applicable) are obtained utilizing NASCET criteria, using the distal internal carotid diameter as the denominator. RADIATION DOSE REDUCTION: This exam was performed according to the departmental dose-optimization program which includes automated exposure control, adjustment of the mA and/or kV according to patient size and/or use of iterative reconstruction technique. CONTRAST:  75mL OMNIPAQUE  IOHEXOL  350 MG/ML SOLN COMPARISON:  None Available. FINDINGS: CT HEAD Brain: Normal cerebral volume. No midline shift, ventriculomegaly, mass effect, evidence of mass lesion, intracranial hemorrhage or evidence of cortically based acute infarction. Gray-white matter differentiation is within normal limits throughout the brain. Calvarium and skull base: Intact, negative. Paranasal sinuses: Visualized paranasal sinuses and mastoids are clear. Orbits: Visualized orbits and scalp soft tissues are within normal limits. Numerous but subcentimeter level 5 suboccipital lymph nodes incidentally noted. CTA NECK Skeleton: Negative. Upper chest: Negative. Other neck: Nonvascular neck soft tissue spaces appear negative. Aortic arch: Mild cardiac pulsation artifact.  Normal 3 vessel arch. Right carotid system: Patent and normal. Left carotid system: Patent and normal. Vertebral arteries: Proximal subclavian arteries and vertebral artery origins appear patent and normal. Both vertebral arteries appear patent and  normal to the skull base, fairly codominant. CTA HEAD Posterior circulation: Fairly codominant distal vertebral arteries with normal vertebrobasilar junction. Normal PICA origins. Patent basilar artery without stenosis. Normal SCA and PCA origins. Both posterior communicating arteries are present. Bilateral PCA branches appear normal. Anterior circulation: Both ICA siphons are patent. Normal left siphon, normal left posterior communicating artery origin. Right siphon and posterior communicating artery origin appear normal. Mildly asymmetric right cavernous sinus enhancement is felt to be physiologic. Patent carotid termini. Normal MCA and ACA origins. Normal anterior communicating artery and bilateral ACA branches. Left MCA M1 segment bifurcates early without stenosis (series 15, image 40). Right MCA M1 segment and bifurcation appear patent without stenosis. Bilateral MCA branches are within normal limits. Venous sinuses: Patent, adequate intravenous contrast (series 16, image 33). Anatomic variants: None. Review of the MIP images confirms the above findings IMPRESSION: Normal CTA Head and Neck. Normal CT appearance of the brain. Electronically Signed   By: VEAR Hurst M.D.   On: 08/31/2024 05:29    Procedures Procedures    Medications Ordered in ED Medications  iohexol  (OMNIPAQUE ) 350 MG/ML injection 75 mL (75 mLs Intravenous Contrast Given 08/31/24 0508)  ED Course/ Medical Decision Making/ A&P                          Medical Decision Making Amount and/or Complexity of Data Reviewed Labs: ordered. Radiology: ordered. Decision-making details documented in ED Course.  Risk Prescription drug management.    This patient presents to the ED for concern of headaches, numbness/tingling, this involves an extensive number of treatment options, and is a complaint that carries with it a high risk of complications and morbidity.  I considered the following differential and admission for this acute,  potentially life threatening condition.   MDM:    Patient is overall very well-appearing, HDS, non-toxic, afebrile.   Unlikely SAH: headache is non thunderclap - had negative CTH for similar headache which is gradual and varaible Unlikely Subdural/epidural hematoma: no history of trauma, no anticoagulation. Unlikely Meningitis: afebrile, no meningismus. Unlikely Temporal arteritis: pt < 45 years old.  no tenderness in temporal area Unlikely Acute angle glaucoma: PERRLA, no eye pain.  Considered IIH but patient has no visual changes or positional component of the headache and, CT head does not demonstrate hydrocephalus.  Patient has no focal neurodeficits at this moment on exam except subjective decrease sensation to the right side of her face which she has had intermittently and before in the last several weeks.  Do not believe she needs to be stroke coded or have an emergent brain MRI to rule out ischemic cause as her symptoms do not localize and sometimes are bilateral.  I have higher suspicion for complex migraine symptoms or functional symptoms.  She has no significant electrolyte derangements or hypo-/hyperglycemia, TSH and free T4 within normal limits as well.  Performed shared decision making with family about lyme disease test and relay its unlikelihood but they are worried; informed them that it will not result while they are here and they can follow-up the result.  She has no palpitations, visual changes, cranial nerve palsies evident on exam.  Does not have a history of a tick bite and did not have any rashes that she is noted.  Believe Lyme disease is less likely than complex migraine.  Offered patient headache cocktail but she declines.  Patient has reassuring CTA head and neck.  Her symptoms have been going on now for greater than 1 month and she has an appointment scheduled with neurology next week.  I encouraged patient to take Tylenol ibuprofen  for her pain at home and to follow-up  with her neurologist for intermittent and variable neuropathy/paresthesias.   Clinical Course as of 08/31/24 0541  Sat Aug 31, 2024  0534 CT ANGIO HEAD NECK W WO CM Normal CTA Head and Neck. Normal CT appearance of the brain. [HN]    Clinical Course User Index [HN] Franklyn Sid SAILOR, MD    Labs: I Ordered, and personally interpreted labs.  The pertinent results include:  those listed above  Imaging Studies ordered: I ordered imaging studies including CTA H&N I independently visualized and interpreted imaging. I agree with the radiologist interpretation  Additional history obtained from chart review, grandmother at bedside.    Reevaluation: After the interventions noted above, I reevaluated the patient and found that they have :stayed the same  Social Determinants of Health: Lives independently  Disposition: DC with neurology follow-up in 1 week.  Given discharge instructions and return precautions, all questions answered to patient satisfaction.  Co morbidities that complicate the patient evaluation  Past Medical History:  Diagnosis  Date   Allergic rhinitis    Allergy    Anxiety    Phreesia 03/03/2021   Asthma    reactive airway disease as infant.   Bronchospasm      Medicines Meds ordered this encounter  Medications   iohexol  (OMNIPAQUE ) 350 MG/ML injection 75 mL    I have reviewed the patients home medicines and have made adjustments as needed  Problem List / ED Course: Problem List Items Addressed This Visit   None Visit Diagnoses       Paresthesias    -  Primary     Nonintractable headache, unspecified chronicity pattern, unspecified headache type                       This note was created using dictation software, which may contain spelling or grammatical errors.    Franklyn Sid SAILOR, MD 08/31/24 [phone]

## 2024-09-02 LAB — LYME DISEASE SEROLOGY W/REFLEX: Lyme Total Antibody EIA: NEGATIVE

## 2024-09-09 NOTE — ED Notes (Signed)
 Bed: DIS17 Expected date:  Expected time:  Means of arrival:  Comments:   Katherinne Alberto Navas, LPN Licensed Nurse

## 2024-09-09 NOTE — Telephone Encounter (Signed)
**Note De-identified  [place] Obfuscation** Please advise 

## 2024-09-09 NOTE — ED Provider Notes (Signed)
 Received signout from Moparthi, Emergency Provider, at 2130  ED Course Results were reviewed.   ED Course as of 09/09/24 2137  Mon Sep 09, 2024  2044 23 yo F presents to the ED with chest pain. Vitals stable. Physical exam as above. Ddx: musculoskeletal pain, costochondritis, ACS, PTX, anxiety. ECG with NSR, rate 89 bpm, normal intervals, normal axis, no significant ST segment changes per my independent interpretation.  [YM]  2045 CXR without findings of focal consolidation, PTX or pleural effusion per my independent interpretation.  [YM]  2122 Lab work without leukocytosis or significant electrolyte abnormalities. Troponin within normal limits.  [YM]  2124 64F pw intermittent chest pain for 2-3 weeks. Reproducible on exam. Negative dimer recently. No improvement with nsaids and muscle relaxers. Getting toradol, needs, trop, reeval.  [JB]  2126 Troponin I, High Sensitivity normal [JB]  2129 Patient's work-up today overall unremarkable. Heart score of 0. Given patient's exam and hx, suspect that patient's pain is more musculoskeletal in nature. Will give patient pain medications. Also suspect a component of anxiety, so will also start patient on atarax. Encouraged patient to f/u with her PCP. Return precautions given.  [YM]    ED Course User Index [JB] Fairy Sprang, MD [YM] Ludger Edin, DO      Clinical Impressions as of 09/09/24 2137  Chest pain, unspecified type  Anxiety    DISPOSITION:   DIAGNOSIS: 1. Chest pain, unspecified type     MEDICATIONS: New Prescriptions   No medications on file    FOLLOW-UP: Merlin Rancher, NP [address] [place] [address] [phone]  Schedule an appointment as soon as possible for a visit    [facility] [address] [place]  [id number] [phone]  If symptoms worsen   _______________________________  _______________________________   Chart Reconciliation:  Moparthi  was the primary emergency provider of record.     Fairy Sprang, MD Physician

## 2024-09-09 NOTE — ED Provider Notes (Signed)
 Department of Emergency Medicine   This patient was seen by Dr. Ada. Chief Complaint   Chest Pain (Patient came in for substernal chest pain onset at 2 pm earlier; pain radiates to the left side under the breast; mom said pain has been going on since Wednesday and saw the PCP already; was advised to come if pain persists; took tylenol at home)    History of Present Illness   23 yo F presents to the ED with chest pain. Patient states that she has been having intermittent left-sided chest pain for the past few weeks. She notes that the pain is sharp in nature and worsens with taking a deep breath or eating. Patient was evaluated in the ED for this a few days ago. She had a fairly unremarkable work-up and was discharged with NSAIDS and muscle relaxors. She notes that she has been taking these medications, but denies any relief of her sx. She denies any SOB, fevers, chills, nausea, vomiting, or any other symptoms.   The history is provided by the patient.       Review of Systems   Review of Systems  Constitutional:  Negative for fever.  HENT:  Negative for rhinorrhea.   Eyes:  Negative for visual disturbance.  Respiratory:  Negative for shortness of breath.   Cardiovascular:  Positive for chest pain.  Gastrointestinal:  Negative for abdominal pain, nausea and vomiting.  Genitourinary:  Negative for hematuria.  Musculoskeletal:  Negative for myalgias.  Skin:  Negative for rash.  Neurological:  Negative for weakness.      Medical History    Past Medical History[1] Past Surgical History[2] No family history on file. Social History   Social History Narrative  . Not on file    Social History   Tobacco Use  . Smoking status: Never  . Smokeless tobacco: Never  Substance Use Topics  . Alcohol use: Not on file  . Drug use: Never       Physical Exam   Initial Vitals: Temp: 36.8 C (98.2 F) Heart Rate: 101 BP: 116/73 Resp: 18 SpO2: 98 % O2 Delivery Device: Room  air Complete ED Vitals Report <redacted file path>  Physical Exam Constitutional:      Appearance: Normal appearance.  HENT:     Head: Normocephalic and atraumatic.     Nose: Nose normal.     Mouth/Throat:     Mouth: Mucous membranes are moist.     Pharynx: Oropharynx is clear.  Eyes:     Conjunctiva/sclera: Conjunctivae normal.     Pupils: Pupils are equal, round, and reactive to light.  Cardiovascular:     Rate and Rhythm: Normal rate and regular rhythm.     Pulses: Normal pulses.     Heart sounds: Normal heart sounds.     Comments: Chest wall tenderness over the left lower chest wall; reproducible pain. No crepitus. No abrasions or rashes over the skin.  Pulmonary:     Effort: Pulmonary effort is normal.     Breath sounds: Normal breath sounds.  Abdominal:     Palpations: Abdomen is soft.     Tenderness: There is no abdominal tenderness.     Comments: Abdomen soft, non-distended. No tenderness to palpation. No masses.  Musculoskeletal:        General: Normal range of motion.     Cervical back: Normal range of motion and neck supple.     Comments: No peripheral edema.   Skin:    General: Skin is warm.  Neurological:     Mental Status: She is alert. Mental status is at baseline.       Procedures   Procedures No notes on file    Medical Decision Making & ED Course                            Medical Decision Making Amount and/or Complexity of Data Reviewed Labs: ordered. Radiology: ordered.  Risk Prescription drug management.     ED Course: ED Course as of 09/09/24 2137  Mon Sep 09, 2024  2044 23 yo F presents to the ED with chest pain. Vitals stable. Physical exam as above. Ddx: musculoskeletal pain, costochondritis, ACS, PTX, anxiety. ECG with NSR, rate 89 bpm, normal intervals, normal axis, no significant ST segment changes per my independent interpretation.  [YM]  2045 CXR without findings of focal consolidation, PTX or pleural effusion per my  independent interpretation.  [YM]  2122 Lab work without leukocytosis or significant electrolyte abnormalities. Troponin within normal limits.  [YM]  2124 77F pw intermittent chest pain for 2-3 weeks. Reproducible on exam. Negative dimer recently. No improvement with nsaids and muscle relaxers. Getting toradol, needs, trop, reeval.  [JB]  2126 Troponin I, High Sensitivity normal [JB]  2129 Patient's work-up today overall unremarkable. Heart score of 0. Given patient's exam and hx, suspect that patient's pain is more musculoskeletal in nature. Will give patient pain medications. Also suspect a component of anxiety, so will also start patient on atarax. Encouraged patient to f/u with her PCP. Return precautions given.  [YM]    ED Course User Index [JB] Fairy Sprang, MD [YM] Ludger Edin, DO      Clinical Impressions as of 09/09/24 2137  Chest pain, unspecified type  Anxiety     Labs Reviewed  CBC WITH DIFFERENTIAL - Abnormal; Notable for the following components:      Result Value   Platelets 453 (*)    Eosinophils Percent 0.2 (*)    All other components within normal limits  COMPREHENSIVE METABOLIC PANEL - Abnormal; Notable for the following components:   Chloride 108 (*)    Bun 5 (*)    Creatinine 0.5 (*)    All other components within normal limits   Narrative:    Estimated GFR was calculated with the 2021 CKD-EPI equation that does not use a race coefficient. eGFR is calculated from serum creatinine, age and sex. Caution should be used when interpreting eGFR for transgender patients, patients with rapidly changing serum creatinine, or extremes of body composition or diet. When more accurate assessments of GFR are needed for clinical care consider measured GFR or consultation with a nephrologist.  TROPONIN I, HIGH SENSITIVITY - Normal  URINE HCG QUALITATIVE - Normal   Medications  ketorolac (TORADOL) injection 30 mg (30 mg Intravenous Given 09/09/24 2100)   XR CHEST SINGLE  VW PORTABLE  Final Result  IMPRESSION:    Unremarkable portable chest x-ray.      Thank you for this referral. For further consultation, please call  [phone] or [phone] (phones answered 24/7/365).     Electronically Signed by Rodgers Capri, MD  09/09/2024 9:02 PM EDT  [facility] Radiology & [facility]         ED Summary   Complete information about this patient's ED visit can be reviewed via the ED Summary by clicking here. <redacted file path>           [1] No past medical history  on file. [2] History reviewed. No pertinent surgical history.  Ludger Edin, DO Physician

## 2024-09-25 ENCOUNTER — Ambulatory Visit: Admission: EM | Admit: 2024-09-25 | Discharge: 2024-09-25 | Disposition: A | Discharge status: Home / Self Care

## 2024-09-25 DIAGNOSIS — J069 Acute upper respiratory infection, unspecified: Secondary | ICD-10-CM | POA: Diagnosis not present

## 2024-09-25 DIAGNOSIS — H9203 Otalgia, bilateral: Secondary | ICD-10-CM

## 2024-09-25 HISTORY — DX: Migraine, unspecified, not intractable, without status migrainosus: G43.909

## 2024-09-25 MED ORDER — PSEUDOEPH-BROMPHEN-DM 30-2-10 MG/5ML PO SYRP
5.0000 mL | ORAL_SOLUTION | Freq: Four times a day (QID) | ORAL | 0 refills | Status: AC | PRN
Start: 2024-09-25 — End: ?

## 2024-09-25 NOTE — Discharge Instructions (Addendum)
 Take the Bromfed-DM as directed.  Follow-up with your primary care provider if your symptoms are not improving.

## 2024-09-25 NOTE — ED Provider Notes (Signed)
 [facility]AL[facility] PELT    CSN: [id number] Arrival date & time: 09/25/24  1453      History   Chief Complaint Chief Complaint  Patient presents with   Otalgia   Facial Pain    HPI Brittney Green is a 23 y.o. female.  Patient presents with 2-day history of ear pain.  She also reports postnasal drip, nasal congestion, cough x 1 week.  She denies fever, chills, ear drainage, sore throat, shortness of breath, vomiting, diarrhea.  No OTC medication taken today; previously treating with Tylenol and ibuprofen .  The history is provided by the patient and medical records.    Past Medical History:  Diagnosis Date   Allergic rhinitis    Allergy    Anxiety    [employer] 03/03/2021   Asthma    reactive airway disease as infant.   Bronchospasm    Migraines     Patient Active Problem List   Diagnosis Date Noted   Recurrent acute suppurative otitis media without spontaneous rupture of tympanic membrane of both sides 05/12/2022   Generalized anxiety disorder 02/25/2020   Overweight child 11/17/2013   Allergic rhinitis     Past Surgical History:  Procedure Laterality Date   tubes in ears      OB History     Gravida  0   Para  0   Term  0   Preterm  0   AB  0   Living  0      SAB  0   IAB  0   Ectopic  0   Multiple  0   Live Births  0            Home Medications    Prior to Admission medications   Medication Sig Start Date End Date Taking? Authorizing Provider  albuterol  (VENTOLIN  HFA) 108 (90 Base) MCG/ACT inhaler Inhale 1-2 puffs into the lungs. 09/06/24  Yes [provider]  brompheniramine-pseudoephedrine -DM 30-2-10 MG/5ML syrup Take 5 mLs by mouth 4 (four) times daily as needed. 09/25/24  Yes Corlis Burnard DEL, NP  hydrOXYzine (ATARAX) 25 MG tablet Take 25 mg by mouth 3 (three) times daily.   Yes [provider]  SUMAtriptan (IMITREX) 50 MG tablet Take 50 mg by mouth daily as needed. 09/13/24  Yes [provider]  topiramate  (TOPAMAX) 50 MG tablet Take by mouth. 09/13/24  Yes [provider]  Vitamin D, Ergocalciferol, (DRISDOL) 1.25 MG (50000 UNIT) CAPS capsule Take 50,000 Units by mouth every 7 (seven) days. 09/08/24  Yes [provider]  cyclobenzaprine  (FLEXERIL ) 10 MG tablet Take 1 tablet (10 mg total) by mouth 2 (two) times daily as needed for muscle spasms. Patient not taking: Reported on 09/25/2024 10/14/23   Billy Asberry FALCON, PA-C  ibuprofen  (ADVIL ) 800 MG tablet Take 1 tablet (800 mg total) by mouth every 8 (eight) hours as needed (pain). 03/03/23   Vonna Sharlet POUR, MD    Family History Family History  Problem Relation Age of Onset   Asthma Mother    COPD Maternal Grandmother    Emphysema Maternal Grandmother    Diabetes Maternal Grandfather    Depression Sister    Alcohol abuse Maternal Aunt        per patient history form drug abuse    Social History Social History   Tobacco Use   Smoking status: Never   Smokeless tobacco: Never  Vaping Use   Vaping status: Never Used  Substance Use Topics   Alcohol use:  No   Drug use: No     Allergies   Amoxicillin    Review of Systems Review of Systems  Constitutional:  Negative for chills and fever.  HENT:  Positive for congestion, ear pain and postnasal drip. Negative for sore throat.   Respiratory:  Positive for cough. Negative for shortness of breath.      Physical Exam Triage Vital Signs ED Triage Vitals  Encounter Vitals Group     BP 09/25/24 1608 124/84     Girls Systolic BP Percentile --      Girls Diastolic BP Percentile --      Boys Systolic BP Percentile --      Boys Diastolic BP Percentile --      Pulse Rate 09/25/24 1608 93     Resp 09/25/24 1608 18     Temp 09/25/24 1608 98.4 F (36.9 C)     Temp src --      SpO2 09/25/24 1608 95 %     Weight --      Height --      Head Circumference --      Peak Flow --      Pain Score 09/25/24 1609 4     Pain Loc --      Pain Education --      Exclude from  Growth Chart --    No data found.  Updated Vital Signs BP 124/84   Pulse 93   Temp 98.4 F (36.9 C)   Resp 18   LMP 09/11/2024   SpO2 95%   Visual Acuity Right Eye Distance:   Left Eye Distance:   Bilateral Distance:    Right Eye Near:   Left Eye Near:    Bilateral Near:     Physical Exam Constitutional:      General: She is not in acute distress. HENT:     Right Ear: Tympanic membrane normal.     Left Ear: Tympanic membrane normal.     Nose: Nose normal.     Mouth/Throat:     Mouth: Mucous membranes are moist.     Pharynx: Oropharynx is clear.     Comments: Clear PND Cardiovascular:     Rate and Rhythm: Normal rate and regular rhythm.     Heart sounds: Normal heart sounds.  Pulmonary:     Effort: Pulmonary effort is normal. No respiratory distress.     Breath sounds: Normal breath sounds.  Neurological:     Mental Status: She is alert.      UC Treatments / Results  Labs (all labs ordered are listed, but only abnormal results are displayed) Labs Reviewed - No data to display  EKG   Radiology No results found.  Procedures Procedures (including critical care time)  Medications Ordered in UC Medications - No data to display  Initial Impression / Assessment and Plan / UC Course  I have reviewed the triage vital signs and the nursing notes.  Pertinent labs & imaging results that were available during my care of the patient were reviewed by me and considered in my medical decision making (see chart for details).    Viral URI, bilateral otalgia.  No indication of infection at this time.  Afebrile and vital signs are stable.  Lungs are clear and O2 sat is 95% on room air.  Treating today with Bromfed-DM.  Tylenol as needed.  Education provided on viral respiratory infection.  Instructed patient to follow-up with her PCP if she is not improving.  She agrees to plan of care.  Final Clinical Impressions(s) / UC Diagnoses   Final diagnoses:  Viral URI   Otalgia of both ears     Discharge Instructions      Take the Bromfed-DM as directed.  Follow-up with your primary care provider if your symptoms are not improving.      ED Prescriptions     Medication Sig Dispense Auth. Provider   brompheniramine-pseudoephedrine -DM 30-2-10 MG/5ML syrup Take 5 mLs by mouth 4 (four) times daily as needed. 120 mL Corlis Burnard DEL, NP      PDMP not reviewed this encounter.   Corlis Burnard DEL, NP 09/25/24 [phone]

## 2024-09-25 NOTE — ED Triage Notes (Signed)
 Patient to Urgent Care with complaints of right sided ear pain/ facial pain/ nasal congestion.  Symptoms x1 week. Developed left sided ear pain x2 days.   Meds: tylenol and ibuprofen .

## 2024-10-15 ENCOUNTER — Ambulatory Visit: Admitting: Family Medicine
